# Patient Record
Sex: Male | Born: 1960 | Race: Black or African American | Hispanic: No | Marital: Single | Smoking: Never smoker
Health system: Southern US, Community
[De-identification: ages and names within clinical notes are randomized; demographics above are authoritative.]

## PROBLEM LIST (undated history)

## (undated) DIAGNOSIS — I10 Essential (primary) hypertension: Secondary | ICD-10-CM

---

## 2011-09-26 ENCOUNTER — Emergency Department: Payer: Self-pay | Admitting: Emergency Medicine

## 2012-06-14 ENCOUNTER — Emergency Department: Payer: Self-pay | Admitting: Internal Medicine

## 2015-10-09 ENCOUNTER — Emergency Department (HOSPITAL_COMMUNITY)
Admission: EM | Admit: 2015-10-09 | Discharge: 2015-10-10 | Disposition: A | Discharge status: Home / Self Care | Payer: Self-pay | Attending: Emergency Medicine | Admitting: Emergency Medicine

## 2015-10-09 ENCOUNTER — Encounter (HOSPITAL_COMMUNITY): Payer: Self-pay | Admitting: *Deleted

## 2015-10-09 DIAGNOSIS — Y9389 Activity, other specified: Secondary | ICD-10-CM | POA: Insufficient documentation

## 2015-10-09 DIAGNOSIS — Y92410 Unspecified street and highway as the place of occurrence of the external cause: Secondary | ICD-10-CM | POA: Insufficient documentation

## 2015-10-09 DIAGNOSIS — I10 Essential (primary) hypertension: Secondary | ICD-10-CM | POA: Insufficient documentation

## 2015-10-09 DIAGNOSIS — Z880 Allergy status to penicillin: Secondary | ICD-10-CM | POA: Insufficient documentation

## 2015-10-09 DIAGNOSIS — Y998 Other external cause status: Secondary | ICD-10-CM | POA: Insufficient documentation

## 2015-10-09 DIAGNOSIS — S29001A Unspecified injury of muscle and tendon of front wall of thorax, initial encounter: Secondary | ICD-10-CM | POA: Insufficient documentation

## 2015-10-09 HISTORY — DX: Essential (primary) hypertension: I10

## 2015-10-09 NOTE — ED Notes (Signed)
C/o pain to rib cage area and headache.

## 2015-10-09 NOTE — ED Notes (Signed)
pt states he has a hx of HTN  And was prescribed medicine a long time ago but does not take any.
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
## 2015-10-10 ENCOUNTER — Emergency Department (HOSPITAL_COMMUNITY): Payer: Self-pay

## 2015-10-10 MED ORDER — LISINOPRIL 10 MG PO TABS: 10.0000 mg | ORAL_TABLET | Freq: Every day | ORAL | Status: DC

## 2015-10-10 MED ORDER — DIAZEPAM 2 MG PO TABS: 2.0000 mg | ORAL_TABLET | Freq: Three times a day (TID) | ORAL | Status: DC
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
## 2015-10-10 NOTE — ED Notes (Signed)
Pt verbalized understanding of d/c instructions and has no further questions. Pt stable and NAD.  

## 2016-02-22 ENCOUNTER — Encounter: Payer: Self-pay | Admitting: Emergency Medicine

## 2016-02-22 ENCOUNTER — Emergency Department
Admission: EM | Admit: 2016-02-22 | Discharge: 2016-02-22 | Disposition: A | Discharge status: Home / Self Care | Payer: No Typology Code available for payment source | Attending: Emergency Medicine | Admitting: Emergency Medicine

## 2016-02-22 DIAGNOSIS — T7840XA Allergy, unspecified, initial encounter: Secondary | ICD-10-CM

## 2016-02-22 DIAGNOSIS — Z79899 Other long term (current) drug therapy: Secondary | ICD-10-CM | POA: Insufficient documentation

## 2016-02-22 DIAGNOSIS — I10 Essential (primary) hypertension: Secondary | ICD-10-CM | POA: Insufficient documentation

## 2016-02-22 MED ORDER — DIPHENHYDRAMINE HCL 50 MG/ML IJ SOLN
INTRAMUSCULAR | Status: AC
  Administered 2016-02-22: 50 mg via INTRAVENOUS
  Filled 2016-02-22: qty 1

## 2016-02-22 MED ORDER — METHYLPREDNISOLONE SODIUM SUCC 125 MG IJ SOLR
INTRAMUSCULAR | Status: AC
  Administered 2016-02-22: 120 mg via INTRAVENOUS
  Filled 2016-02-22: qty 2

## 2016-02-22 MED ORDER — FAMOTIDINE IN NACL 20-0.9 MG/50ML-% IV SOLN
INTRAVENOUS | Status: AC
  Administered 2016-02-22: 20 mg via INTRAVENOUS
  Filled 2016-02-22: qty 50

## 2016-02-22 MED ORDER — DIPHENHYDRAMINE HCL 50 MG/ML IJ SOLN
50.0000 mg | Freq: Once | INTRAMUSCULAR | Status: AC
  Administered 2016-02-22: 50 mg via INTRAVENOUS

## 2016-02-22 MED ORDER — FAMOTIDINE IN NACL 20-0.9 MG/50ML-% IV SOLN
20.0000 mg | Freq: Once | INTRAVENOUS | Status: AC
  Administered 2016-02-22: 20 mg via INTRAVENOUS

## 2016-02-22 MED ORDER — EPINEPHRINE 0.3 MG/0.3ML IJ SOAJ: 0.3000 mg | Freq: Once | INTRAMUSCULAR | Status: AC

## 2016-02-22 MED ORDER — METHYLPREDNISOLONE SODIUM SUCC 125 MG IJ SOLR
125.0000 mg | Freq: Once | INTRAMUSCULAR | Status: AC
  Administered 2016-02-22: 120 mg via INTRAVENOUS

## 2016-02-22 MED ORDER — PREDNISONE 50 MG PO TABS: 50.0000 mg | ORAL_TABLET | Freq: Every day | ORAL | Status: DC

## 2016-02-22 MED ORDER — SODIUM CHLORIDE 0.9 % IV BOLUS (SEPSIS)
1000.0000 mL | Freq: Once | INTRAVENOUS | Status: AC
  Administered 2016-02-22: 1000 mL via INTRAVENOUS

## 2016-02-22 NOTE — ED Notes (Signed)
Dr. Corky Downs at bedside assessing and speaking with patient.

## 2016-02-22 NOTE — ED Notes (Signed)
States he is having an allergic reaction to dye used to color his mustache. Onset ~ 30 mins ago. Rash, itching, anxious, +lip swelling, throat scratching.   Hx of similar episode when he use "just for men"

## 2016-02-22 NOTE — ED Notes (Signed)
Charge nurse aware of pt

## 2016-02-22 NOTE — ED Notes (Signed)
Patient takes lisinopril daily for BP.

## 2016-02-22 NOTE — ED Provider Notes (Signed)
[facility] Emergency Department Provider Note  ____________________________________________    I have reviewed the triage vital signs and the nursing notes.   HISTORY  Chief Complaint Allergic Reaction    HPI Michael Kemp is a 55 y.o. male who presents with complaints of an allergic reaction. He feels this is likely related to a dye that he is to color his mustache. He reports onset of diffuse itching, rash and scratchy throat and lip swellingapproximately 30 minutes prior to arrival. He denies shortness of breath or difficulty breathing. He reports similar episode in the past related to using "just for men". Of note he also uses lisinopril for blood pressure.     Past Medical History  Diagnosis Date  . Hypertension     There are no active problems to display for this patient.   History reviewed. No pertinent past surgical history.  Current Outpatient Rx  Name  Route  Sig  Dispense  Refill  . lisinopril (PRINIVIL,ZESTRIL) 10 MG tablet   Oral   Take 1 tablet (10 mg total) by mouth daily.   30 tablet   0   . diazepam (VALIUM) 2 MG tablet   Oral   Take 1 tablet (2 mg total) by mouth 3 (three) times daily.   10 tablet   0     For three days   . guaiFENesin (MUCINEX) 600 MG 12 hr tablet   Oral   Take 600 mg by mouth 2 (two) times daily as needed for cough or to loosen phlegm.           Allergies Amoxicillin  History reviewed. No pertinent family history.  Social History Social History  Substance Use Topics  . Smoking status: Never Smoker   . Smokeless tobacco: None  . Alcohol Use: No    Review of Systems  Constitutional: Negative for fever. Eyes: Negative for redness ENT: As above Cardiovascular: Negative for chest pain Respiratory: Negative for shortness of breath. No stridor Gastrointestinal: Negative for abdominal pain Genitourinary: Negative for dysuria. Musculoskeletal: Negative for back pain. Skin: As  above Neurological: Negative for focal weakness Psychiatric: no anxiety    ____________________________________________   PHYSICAL EXAM:  VITAL SIGNS: ED Triage Vitals  Enc Vitals Group     BP 02/22/16 1215 90/49 mmHg     Pulse Rate 02/22/16 1215 110     Resp 02/22/16 1215 18     Temp 02/22/16 1215 97.7 F (36.5 C)     Temp Source 02/22/16 1215 Oral     SpO2 02/22/16 1215 98 %     Weight 02/22/16 1215 220 lb (99.791 kg)     Height 02/22/16 1215 5\' 9"  (1.753 m)     Head Cir --      Peak Flow --      Pain Score 02/22/16 1217 9     Pain Loc --      Pain Edu? --      Excl. in [place]? --      Constitutional: Alert and oriented. Well appearing and in no distress.  Eyes: Conjunctivae are normal. No erythema or injection ENT   Head: Normocephalic and atraumatic.   Mouth/Throat: Mucous membranes are moist.Pharynx is normal. Right upper lip is slightly enlarged, no intraoral swelling Cardiovascular: Normal rate, regular rhythm. Normal and symmetric distal pulses are present in the upper extremities.  Respiratory: Normal respiratory effort without tachypnea nor retractions. Breath sounds are clear and equal bilaterally.  Gastrointestinal: Soft and non-tender in all quadrants.  No distention. There is no CVA tenderness. Genitourinary: deferred Musculoskeletal: Nontender with normal range of motion in all extremities. No lower extremity tenderness nor edema. Neurologic:  Normal speech and language. No gross focal neurologic deficits are appreciated. Skin:  Skin is warm, dry and intact. No visible rash Psychiatric: Mood and affect are normal. Patient exhibits appropriate insight and judgment.  ____________________________________________    LABS (pertinent positives/negatives)  Labs Reviewed - No data to display  ____________________________________________   EKG  None  ____________________________________________     RADIOLOGY  None  ____________________________________________   PROCEDURES  Procedure(s) performed: none  Critical Care performed: none  ____________________________________________   INITIAL IMPRESSION / ASSESSMENT AND PLAN / ED COURSE  Pertinent labs & imaging results that were available during my care of the patient were reviewed by me and considered in my medical decision making (see chart for details).  Patient given IV Solu-Medrol, Pepcid, Benadryl and IV fluids for suspected allergic reaction. I discussed my concerns that lisinopril may be the actual cause of his recurrent symptoms and recommended that he stopped the medication the patient agreed to do so he will change his medication with his PCP. We will observe the patient in the ED and reevaluate  ----------------------------------------- 3:27 PM on 02/22/2016 -----------------------------------------  Patient resting comfortably. He reports near complete resolution of his symptoms. I feel he is safe for discharge now I'll discharge him with prednisone for 5 days. Recommend DC lisinopril. EpiPen prescribed as well  ____________________________________________   FINAL CLINICAL IMPRESSION(S) / ED DIAGNOSES  Final diagnoses:  Allergic reaction, initial encounter          Lavonia Drafts, MD 02/22/16 1527

## 2016-02-22 NOTE — Discharge Instructions (Signed)

## 2022-01-13 ENCOUNTER — Inpatient Hospital Stay (HOSPITAL_COMMUNITY)
Admission: EM | Admit: 2022-01-13 | Discharge: 2022-02-01 | DRG: 871 | Disposition: E | Payer: Medicaid Other | Attending: Emergency Medicine | Admitting: Emergency Medicine

## 2022-01-13 ENCOUNTER — Encounter (HOSPITAL_COMMUNITY): Payer: Self-pay | Admitting: Emergency Medicine

## 2022-01-13 ENCOUNTER — Emergency Department (HOSPITAL_COMMUNITY): Payer: Medicaid Other

## 2022-01-13 ENCOUNTER — Other Ambulatory Visit: Payer: Self-pay

## 2022-01-13 DIAGNOSIS — R6521 Severe sepsis with septic shock: Secondary | ICD-10-CM | POA: Diagnosis present

## 2022-01-13 DIAGNOSIS — G9341 Metabolic encephalopathy: Secondary | ICD-10-CM | POA: Diagnosis present

## 2022-01-13 DIAGNOSIS — Z9221 Personal history of antineoplastic chemotherapy: Secondary | ICD-10-CM

## 2022-01-13 DIAGNOSIS — C7949 Secondary malignant neoplasm of other parts of nervous system: Secondary | ICD-10-CM | POA: Diagnosis present

## 2022-01-13 DIAGNOSIS — E440 Moderate protein-calorie malnutrition: Secondary | ICD-10-CM | POA: Diagnosis not present

## 2022-01-13 DIAGNOSIS — Z800 Family history of malignant neoplasm of digestive organs: Secondary | ICD-10-CM

## 2022-01-13 DIAGNOSIS — J90 Pleural effusion, not elsewhere classified: Secondary | ICD-10-CM | POA: Diagnosis not present

## 2022-01-13 DIAGNOSIS — I63441 Cerebral infarction due to embolism of right cerebellar artery: Secondary | ICD-10-CM | POA: Diagnosis present

## 2022-01-13 DIAGNOSIS — C787 Secondary malignant neoplasm of liver and intrahepatic bile duct: Secondary | ICD-10-CM | POA: Diagnosis present

## 2022-01-13 DIAGNOSIS — I2609 Other pulmonary embolism with acute cor pulmonale: Secondary | ICD-10-CM | POA: Diagnosis not present

## 2022-01-13 DIAGNOSIS — Z7189 Other specified counseling: Secondary | ICD-10-CM | POA: Diagnosis not present

## 2022-01-13 DIAGNOSIS — E876 Hypokalemia: Secondary | ICD-10-CM | POA: Diagnosis present

## 2022-01-13 DIAGNOSIS — L8990 Pressure ulcer of unspecified site, unspecified stage: Secondary | ICD-10-CM | POA: Diagnosis present

## 2022-01-13 DIAGNOSIS — D649 Anemia, unspecified: Secondary | ICD-10-CM | POA: Diagnosis present

## 2022-01-13 DIAGNOSIS — R188 Other ascites: Secondary | ICD-10-CM | POA: Diagnosis present

## 2022-01-13 DIAGNOSIS — G931 Anoxic brain damage, not elsewhere classified: Secondary | ICD-10-CM | POA: Diagnosis present

## 2022-01-13 DIAGNOSIS — H5500 Unspecified nystagmus: Secondary | ICD-10-CM | POA: Diagnosis present

## 2022-01-13 DIAGNOSIS — N170 Acute kidney failure with tubular necrosis: Secondary | ICD-10-CM | POA: Diagnosis present

## 2022-01-13 DIAGNOSIS — R64 Cachexia: Secondary | ICD-10-CM | POA: Diagnosis present

## 2022-01-13 DIAGNOSIS — I2699 Other pulmonary embolism without acute cor pulmonale: Secondary | ICD-10-CM | POA: Diagnosis present

## 2022-01-13 DIAGNOSIS — E509 Vitamin A deficiency, unspecified: Secondary | ICD-10-CM | POA: Diagnosis present

## 2022-01-13 DIAGNOSIS — J690 Pneumonitis due to inhalation of food and vomit: Secondary | ICD-10-CM | POA: Diagnosis present

## 2022-01-13 DIAGNOSIS — A419 Sepsis, unspecified organism: Secondary | ICD-10-CM | POA: Diagnosis present

## 2022-01-13 DIAGNOSIS — I82413 Acute embolism and thrombosis of femoral vein, bilateral: Secondary | ICD-10-CM | POA: Diagnosis not present

## 2022-01-13 DIAGNOSIS — L89152 Pressure ulcer of sacral region, stage 2: Secondary | ICD-10-CM | POA: Diagnosis present

## 2022-01-13 DIAGNOSIS — Z79899 Other long term (current) drug therapy: Secondary | ICD-10-CM

## 2022-01-13 DIAGNOSIS — E1165 Type 2 diabetes mellitus with hyperglycemia: Secondary | ICD-10-CM | POA: Diagnosis not present

## 2022-01-13 DIAGNOSIS — C259 Malignant neoplasm of pancreas, unspecified: Secondary | ICD-10-CM | POA: Diagnosis present

## 2022-01-13 DIAGNOSIS — I10 Essential (primary) hypertension: Secondary | ICD-10-CM | POA: Diagnosis present

## 2022-01-13 DIAGNOSIS — K7200 Acute and subacute hepatic failure without coma: Secondary | ICD-10-CM | POA: Diagnosis present

## 2022-01-13 DIAGNOSIS — E870 Hyperosmolality and hypernatremia: Secondary | ICD-10-CM | POA: Diagnosis not present

## 2022-01-13 DIAGNOSIS — I6350 Cerebral infarction due to unspecified occlusion or stenosis of unspecified cerebral artery: Secondary | ICD-10-CM | POA: Diagnosis not present

## 2022-01-13 DIAGNOSIS — C7931 Secondary malignant neoplasm of brain: Secondary | ICD-10-CM | POA: Diagnosis present

## 2022-01-13 DIAGNOSIS — Z6821 Body mass index (BMI) 21.0-21.9, adult: Secondary | ICD-10-CM

## 2022-01-13 DIAGNOSIS — K219 Gastro-esophageal reflux disease without esophagitis: Secondary | ICD-10-CM | POA: Diagnosis present

## 2022-01-13 DIAGNOSIS — I82409 Acute embolism and thrombosis of unspecified deep veins of unspecified lower extremity: Secondary | ICD-10-CM | POA: Diagnosis not present

## 2022-01-13 DIAGNOSIS — E43 Unspecified severe protein-calorie malnutrition: Secondary | ICD-10-CM | POA: Diagnosis present

## 2022-01-13 DIAGNOSIS — I82463 Acute embolism and thrombosis of calf muscular vein, bilateral: Secondary | ICD-10-CM | POA: Diagnosis present

## 2022-01-13 DIAGNOSIS — I639 Cerebral infarction, unspecified: Secondary | ICD-10-CM | POA: Diagnosis present

## 2022-01-13 DIAGNOSIS — N179 Acute kidney failure, unspecified: Secondary | ICD-10-CM | POA: Diagnosis present

## 2022-01-13 DIAGNOSIS — Z20822 Contact with and (suspected) exposure to covid-19: Secondary | ICD-10-CM | POA: Diagnosis present

## 2022-01-13 DIAGNOSIS — G936 Cerebral edema: Secondary | ICD-10-CM | POA: Diagnosis present

## 2022-01-13 DIAGNOSIS — E874 Mixed disorder of acid-base balance: Secondary | ICD-10-CM | POA: Diagnosis present

## 2022-01-13 DIAGNOSIS — Z91013 Allergy to seafood: Secondary | ICD-10-CM

## 2022-01-13 DIAGNOSIS — I63541 Cerebral infarction due to unspecified occlusion or stenosis of right cerebellar artery: Secondary | ICD-10-CM | POA: Diagnosis not present

## 2022-01-13 DIAGNOSIS — J9601 Acute respiratory failure with hypoxia: Secondary | ICD-10-CM | POA: Diagnosis not present

## 2022-01-13 DIAGNOSIS — Z931 Gastrostomy status: Secondary | ICD-10-CM

## 2022-01-13 DIAGNOSIS — R627 Adult failure to thrive: Secondary | ICD-10-CM | POA: Diagnosis present

## 2022-01-13 DIAGNOSIS — I82453 Acute embolism and thrombosis of peroneal vein, bilateral: Secondary | ICD-10-CM | POA: Diagnosis present

## 2022-01-13 DIAGNOSIS — A4101 Sepsis due to Methicillin susceptible Staphylococcus aureus: Secondary | ICD-10-CM | POA: Diagnosis not present

## 2022-01-13 DIAGNOSIS — Z515 Encounter for palliative care: Secondary | ICD-10-CM | POA: Diagnosis not present

## 2022-01-13 DIAGNOSIS — Z66 Do not resuscitate: Secondary | ICD-10-CM | POA: Diagnosis not present

## 2022-01-13 DIAGNOSIS — R1310 Dysphagia, unspecified: Secondary | ICD-10-CM | POA: Diagnosis present

## 2022-01-13 DIAGNOSIS — G935 Compression of brain: Secondary | ICD-10-CM | POA: Diagnosis present

## 2022-01-13 DIAGNOSIS — E875 Hyperkalemia: Secondary | ICD-10-CM | POA: Diagnosis not present

## 2022-01-13 DIAGNOSIS — I82443 Acute embolism and thrombosis of tibial vein, bilateral: Secondary | ICD-10-CM | POA: Diagnosis present

## 2022-01-13 DIAGNOSIS — R4182 Altered mental status, unspecified: Secondary | ICD-10-CM | POA: Diagnosis not present

## 2022-01-13 DIAGNOSIS — I82433 Acute embolism and thrombosis of popliteal vein, bilateral: Secondary | ICD-10-CM | POA: Diagnosis present

## 2022-01-13 DIAGNOSIS — E8809 Other disorders of plasma-protein metabolism, not elsewhere classified: Secondary | ICD-10-CM | POA: Diagnosis present

## 2022-01-13 DIAGNOSIS — D72823 Leukemoid reaction: Secondary | ICD-10-CM | POA: Diagnosis not present

## 2022-01-13 DIAGNOSIS — R29704 NIHSS score 4: Secondary | ICD-10-CM | POA: Diagnosis present

## 2022-01-13 DIAGNOSIS — D6869 Other thrombophilia: Secondary | ICD-10-CM | POA: Diagnosis not present

## 2022-01-13 DIAGNOSIS — D696 Thrombocytopenia, unspecified: Secondary | ICD-10-CM | POA: Diagnosis present

## 2022-01-13 DIAGNOSIS — R531 Weakness: Secondary | ICD-10-CM | POA: Diagnosis not present

## 2022-01-13 LAB — COMPREHENSIVE METABOLIC PANEL
ALT: 64 U/L — ABNORMAL HIGH (ref 0–44)
AST: 41 U/L (ref 15–41)
Albumin: 1.6 g/dL — ABNORMAL LOW (ref 3.5–5.0)
Alkaline Phosphatase: 281 U/L — ABNORMAL HIGH (ref 38–126)
Anion gap: 9 (ref 5–15)
BUN: 29 mg/dL — ABNORMAL HIGH (ref 6–20)
CO2: 27 mmol/L (ref 22–32)
Calcium: 8.1 mg/dL — ABNORMAL LOW (ref 8.9–10.3)
Chloride: 100 mmol/L (ref 98–111)
Creatinine, Ser: 1.11 mg/dL (ref 0.61–1.24)
GFR, Estimated: >60 mL/min (ref >60)
Glucose, Bld: 142 mg/dL — ABNORMAL HIGH (ref 70–99)
Potassium: 4.2 mmol/L (ref 3.5–5.1)
Sodium: 136 mmol/L (ref 135–145)
Total Bilirubin: 1.4 mg/dL — ABNORMAL HIGH (ref 0.3–1.2)
Total Protein: 6.4 g/dL — ABNORMAL LOW (ref 6.5–8.1)

## 2022-01-13 LAB — CBC WITH DIFFERENTIAL/PLATELET
Abs Immature Granulocytes: 0 10*3/uL (ref 0.00–0.07)
Basophils Absolute: 0 10*3/uL (ref 0.0–0.1)
Basophils Relative: 0 %
Eosinophils Absolute: 0.6 10*3/uL — ABNORMAL HIGH (ref 0.0–0.5)
Eosinophils Relative: 2 %
HCT: 26.9 % — ABNORMAL LOW (ref 39.0–52.0)
Hemoglobin: 8.8 g/dL — ABNORMAL LOW (ref 13.0–17.0)
Lymphocytes Relative: 4 %
Lymphs Abs: 1.2 10*3/uL (ref 0.7–4.0)
MCH: 28.1 pg (ref 26.0–34.0)
MCHC: 32.7 g/dL (ref 30.0–36.0)
MCV: 85.9 fL (ref 80.0–100.0)
Monocytes Absolute: 1.2 10*3/uL — ABNORMAL HIGH (ref 0.1–1.0)
Monocytes Relative: 4 %
Neutro Abs: 27.1 10*3/uL — ABNORMAL HIGH (ref 1.7–7.7)
Neutrophils Relative %: 90 %
Platelets: 151 10*3/uL (ref 150–400)
RBC: 3.13 MIL/uL — ABNORMAL LOW (ref 4.22–5.81)
RDW: 15.6 % — ABNORMAL HIGH (ref 11.5–15.5)
WBC: 30.1 10*3/uL — ABNORMAL HIGH (ref 4.0–10.5)
nRBC: 0.1 % (ref 0.0–0.2)

## 2022-01-13 LAB — RESP PANEL BY RT-PCR (FLU A&B, COVID) ARPGX2
Influenza A by PCR: NEGATIVE
Influenza B by PCR: NEGATIVE
SARS Coronavirus 2 by RT PCR: NEGATIVE

## 2022-01-13 LAB — CBG MONITORING, ED: Glucose-Capillary: 152 mg/dL — ABNORMAL HIGH (ref 70–99)

## 2022-01-13 LAB — LACTIC ACID, PLASMA
Lactic Acid, Venous: 3 mmol/L (ref 0.5–1.9)
Lactic Acid, Venous: 3.1 mmol/L (ref 0.5–1.9)

## 2022-01-13 LAB — AMMONIA: Ammonia: 25 umol/L (ref 9–35)

## 2022-01-13 MED ORDER — LACTATED RINGERS IV SOLN: INTRAVENOUS | Status: DC

## 2022-01-13 MED ORDER — ENOXAPARIN SODIUM 40 MG/0.4ML IJ SOSY
40.0000 mg | PREFILLED_SYRINGE | INTRAMUSCULAR | Status: DC
  Administered 2022-01-14: 40 mg via SUBCUTANEOUS
  Filled 2022-01-13: qty 0.4

## 2022-01-13 MED ORDER — LACTATED RINGERS IV BOLUS (SEPSIS)
250.0000 mL | Freq: Once | INTRAVENOUS | Status: AC
  Administered 2022-01-13: 250 mL via INTRAVENOUS

## 2022-01-13 MED ORDER — METRONIDAZOLE 500 MG/100ML IV SOLN
500.0000 mg | Freq: Once | INTRAVENOUS | Status: AC
  Administered 2022-01-13: 500 mg via INTRAVENOUS
  Filled 2022-01-13: qty 100

## 2022-01-13 MED ORDER — SODIUM CHLORIDE 0.9 % IV SOLN
2.0000 g | Freq: Once | INTRAVENOUS | Status: AC
  Administered 2022-01-13: 2 g via INTRAVENOUS
  Filled 2022-01-13: qty 12.5

## 2022-01-13 MED ORDER — ORAL CARE MOUTH RINSE
15.0000 mL | Freq: Two times a day (BID) | OROMUCOSAL | Status: DC
  Administered 2022-01-14: 15 mL via OROMUCOSAL

## 2022-01-13 MED ORDER — VANCOMYCIN HCL 1500 MG/300ML IV SOLN
1500.0000 mg | Freq: Once | INTRAVENOUS | Status: AC
  Administered 2022-01-13: 1500 mg via INTRAVENOUS
  Filled 2022-01-13: qty 300

## 2022-01-13 MED ORDER — SODIUM CHLORIDE 0.9 % IV BOLUS
1000.0000 mL | Freq: Once | INTRAVENOUS | Status: AC
  Administered 2022-01-13: 1000 mL via INTRAVENOUS

## 2022-01-13 MED ORDER — LACTATED RINGERS IV BOLUS (SEPSIS)
1000.0000 mL | Freq: Once | INTRAVENOUS | Status: AC
  Administered 2022-01-13: 1000 mL via INTRAVENOUS

## 2022-01-13 MED ORDER — VANCOMYCIN HCL IN DEXTROSE 1-5 GM/200ML-% IV SOLN
1000.0000 mg | Freq: Once | INTRAVENOUS | Status: DC
Start: 2022-01-13 — End: 2022-01-13

## 2022-01-13 MED ORDER — CHLORHEXIDINE GLUCONATE CLOTH 2 % EX PADS
6.0000 | MEDICATED_PAD | Freq: Every day | CUTANEOUS | Status: DC
  Administered 2022-01-14 – 2022-01-19 (×7): 6 via TOPICAL

## 2022-01-13 NOTE — H&P (Addendum)
?History and Physical ? ?Michael Kemp [id number]:[id number] DOB: 05/10/1961 DOA: 01/14/2022 ? ?Referring physician: Lavonna Rua, PA-EDP  ?PCP: Lorelee Market, MD  ?Outpatient Specialists: Medical oncology [facility] ?Patient coming from: Home ? ?Chief Complaint: AMS  ? ?HPI: Michael Kemp is a 61 y.o. 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  Prior to this, the patient was interactive with family, he was showing them how to fold sheets in the beds in their basement as he lives at home with his daughter and son-in-law.  He suddenly became confused.  He complained of abdominal pain at that time per his daughter.  EMS was activated and he was brought into the ED for further evaluation.  In the ED, the patient is somnolent but arousable to voices.  WBC 30,000, lactic acid 3.0, chest x-ray and UA unrevealing.  Due to concern for sepsis code sepsis was called in the ED.  Blood cultures were obtained and the patient was started on IV fluid hydration and broad-spectrum IV antibiotics, IV vancomycin, cefepime and IV Flagyl.  TRH, hospitalist service, was asked to admit. ? ?ED Course: Tmax 98.3.  BP 147/102, pulse 76, respiratory rate 19, O2 saturation 100% on room air.  Lab studies significant for WBC 30.1 K, hemoglobin 8.8, glucose 142, BUN 29, creatinine 1.11, ALT 64, T bilirubin 1.4.  Lactic acid 3.1 from 3.0. ? ?Review of Systems: ?Review of systems as noted in the HPI. All other systems reviewed and are negative. ? ? ?Past Medical History:  ?Diagnosis Date  ? Hypertension   ? ?History reviewed. No pertinent surgical history. ? ?Social History:  reports that he has never smoked. He does not have any smokeless tobacco history on file. He reports that he  does not drink alcohol and does not use drugs. ? ? ?Allergies  ?Allergen Reactions  ? Lisinopril Anaphylaxis  ? Amoxicillin Nausea And Vomiting  ? Aripiprazole Other (See Comments)  ?  Made patient feel "really low" per daughter at bedside  ? Olmesartan Other (See Comments)  ?  "bothered stomach"  ? Shellfish Allergy Nausea Only  ?  "Bad headache"  ? ? ?Family history: ?Father with history of unspecified cancer ?Maternal uncle unspecified cancer in the 45s. ?Maternal aunt pancreatic cancer in her 68s. ? ?Prior to Admission medications   ?Medication Sig Start Date End Date Taking? Authorizing Provider  ?chlorproMAZINE (THORAZINE) 25 MG tablet Take 25 mg by mouth 3 (three) times daily. hiccups 01/08/22 02/07/22 Yes [provider]  ?gabapentin (NEURONTIN) 400 MG capsule Take 400 mg by mouth 2 (two) times daily as needed (hiccups). 01/08/22 02/07/22 Yes [provider]  ?guaiFENesin (ROBITUSSIN) 100 MG/5ML liquid Take 10 mLs by mouth every 4 (four) hours as needed for cough. 01/08/22  Yes [provider]  ?loperamide (IMODIUM) 2 MG capsule If having diarrhea, take 2 capsules (4 mg) after the first loose stool, then 1 capsule after each additional loose stool. 11/25/21 11/25/22 Yes [provider]  ?magic mouthwash (nystatin, diphenhydrAMINE, alum & mag hydroxide) suspension mixture Take 10 mLs by mouth every 2 (two) hours as needed for mouth pain. 01/08/22 02/07/22 Yes [provider]  ?ondansetron (ZOFRAN-ODT) 8 MG disintegrating tablet Take 8 mg by mouth every 8 (eight) hours as  needed for nausea. 01/08/22 02/07/22 Yes [provider]  ?phytonadione (VITAMIN K) 5 MG tablet Take 5 mg by mouth once a week. Tuesday 01/11/22 01/11/23 Yes [provider]  ?potassium & sodium phosphates (PHOS-NAK) 280-160-250 MG PACK Take by mouth. 01/08/22  Yes [provider]  ?thiamine 100 MG tablet Take 100 mg by mouth daily. 01/09/22  Yes [provider]  ?vitamin A 3 MG (10000  UNITS) capsule Take 6 mg by mouth daily. 01/09/22 01/09/23 Yes [provider]  ?EPINEPHrine 0.3 mg/0.3 mL IJ SOAJ injection Inject 0.3 mLs (0.3 mg total) into the muscle once. ?Patient not taking: Reported on 01/02/2022 02/22/16   Lavonia Drafts, MD  ? ? ?Physical Exam: ?BP 103/72   Pulse 85   Temp 98.3 ?F (36.8 ?C) (Oral)   Resp 19   Wt 66.2 kg   SpO2 100%   BMI 21.56 kg/m?  ? ?General: 61 y.o. year-old male well developed well nourished in no acute distress.  Somnolent but arousable to voices.  He follows commands when aroused. ?Cardiovascular: Regular rate and rhythm with no rubs or gallops.  No thyromegaly or JVD noted.  Trace lower extremity edema. 2/4 pulses in all 4 extremities. ?Respiratory: Mild rales at bases.  No wheezing noted.  Poor inspiratory effort. ?Abdomen: Soft nontender nondistended with normal bowel sounds x4 quadrants. ?Muskuloskeletal: No cyanosis or clubbing.  Trace edema noted bilaterally ?Neuro: CN II-XII intact, strength, sensation, reflexes ?Skin: Stage II sacral wound. ?Psychiatry: Judgement and insight appear normal. Mood is appropriate for condition and setting ?   ?   ?   ?Labs on Admission:  ?Basic Metabolic Panel: ?Recent Labs  ?Lab 01/21/2022 ?1739  ?NA 136  ?K 4.2  ?CL 100  ?CO2 27  ?GLUCOSE 142*  ?BUN 29*  ?CREATININE 1.11  ?CALCIUM 8.1*  ? ?Liver Function Tests: ?Recent Labs  ?Lab 01/28/2022 ?1739  ?AST 41  ?ALT 64*  ?ALKPHOS 281*  ?BILITOT 1.4*  ?PROT 6.4*  ?ALBUMIN 1.6*  ? ?No results for input(s): LIPASE, AMYLASE in the last 168 hours. ?Recent Labs  ?Lab 01/22/2022 ?1739  ?AMMONIA 25  ? ?CBC: ?Recent Labs  ?Lab 01/27/2022 ?1739  ?WBC 30.1*  ?NEUTROABS 27.1*  ?HGB 8.8*  ?HCT 26.9*  ?MCV 85.9  ?PLT 151  ? ?Cardiac Enzymes: ?No results for input(s): CKTOTAL, CKMB, CKMBINDEX, TROPONINI in the last 168 hours. ? ?BNP (last 3 results) ?No results for input(s): BNP in the last 8760 hours. ? ?ProBNP (last 3 results) ?No results for input(s): PROBNP in the last 8760  hours. ? ?CBG: ?Recent Labs  ?Lab 01/05/2022 ?1809  ?GLUCAP 152*  ? ? ?Radiological Exams on Admission: ?CT HEAD WO CONTRAST ? ?Result Date: 01/19/2022 ?CLINICAL DATA:  Neuro deficit, acute, stroke suspected EXAM: CT HEAD WITHOUT CONTRAST TECHNIQUE: Contiguous axial images were obtained from the base of the skull through the vertex without intravenous contrast. RADIATION DOSE REDUCTION: This exam was performed according to the departmental dose-optimization program which includes automated exposure control, adjustment of the mA and/or kV according to patient size and/or use of iterative reconstruction technique. COMPARISON:  None. FINDINGS: Brain: No intracranial hemorrhage, mass effect, or midline shift. No hydrocephalus. The basilar cisterns are patent. No evidence of territorial infarct or acute ischemia. No extra-axial or intracranial fluid collection. Vascular: No hyperdense vessel or unexpected calcification. Skull: Normal. Negative for fracture or focal lesion. Sinuses/Orbits: Paranasal sinuses and mastoid air cells are clear. The visualized orbits are unremarkable. Other: None. IMPRESSION: Negative noncontrast head CT. Electronically  Signed   By: Keith Rake M.D.   On: 01/03/2022 18:07  ? ?DG Chest Port 1 View ? ?Result Date: 01/07/2022 ?CLINICAL DATA:  Weakness, abdominal pain and shortness of breath. EXAM: PORTABLE CHEST 1 VIEW COMPARISON:  10/10/2015 FINDINGS: Right chest port with tip at the right atrium. There are streaky opacities at the right lung base. Stable heart size. Similar aortic tortuosity. No pulmonary edema, pleural effusion, or pneumothorax. No acute osseous abnormalities are seen. IMPRESSION: Streaky right lung base opacities, favor atelectasis over pneumonia. Electronically Signed   By: Keith Rake M.D.   On: 01/24/2022 19:16   ? ?EKG: I independently viewed the EKG done and my findings are as followed: Sinus tachycardia rate of 137.  Nonspecific ST-T changes.  QTc  452. ? ?Assessment/Plan ?Present on Admission: ? Sepsis ([place]) ? ?Principal Problem: ?  Sepsis ([place]) ? ?Sepsis, unclear source ?Presented with leukocytosis, WBC 30,000, lactic acid 3.1, heart rate 137, respiratory 28, no clear source of infection

## 2022-01-13 NOTE — ED Provider Notes (Signed)
?[facility] DEPT ?Provider Note ? ? ?CSN: [id number] ?Arrival date & time: 01/23/2022  1505 ? ?  ? ?History ? ?Chief Complaint  ?Patient presents with  ? Abdominal Pain  ? Weakness  ? ? ?Michael Kemp is a 61 y.o. male. ? 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  History provided by patient's sister and daughter who the patient lives with and are present in the room.  They state that earlier today the patient was up and walking around and folding clothes when he suddenly became confused and lethargic and stated he was unable to hold his head up.  Family states that this was around 2 PM today.  They state that prior to this event the patient was behaving normally and had no complaints.  The patient who is able to mumble short responses denies any pain or symptoms.  Of note, patient is not actively on chemotherapy and receives all of his oncology care through [facility].  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  ? ?The history is provided by the patient. No language interpreter was used.  ?Abdominal Pain ?Associated symptoms: fatigue   ?Associated symptoms: no chest pain, no chills, no fever and no shortness of breath   ?Weakness ?Associated symptoms: no abdominal pain, no chest pain, no fever and no shortness of breath   ? ?  ? ?Home Medications ?Prior to Admission medications   ?Medication Sig Start Date End Date Taking? Authorizing Provider  ?diazepam (VALIUM) 2 MG tablet Take 1 tablet (2 mg total) by mouth 3 (three) times daily. 10/10/15   Carmin Muskrat, MD  ?EPINEPHrine 0.3 mg/0.3 mL IJ SOAJ injection Inject 0.3 mLs (0.3 mg total) into the muscle once. 02/22/16   Lavonia Drafts, MD  ?guaiFENesin (MUCINEX) 600 MG 12 hr tablet Take 600 mg by mouth 2 (two) times daily as needed for cough  or to loosen phlegm.    [provider]  ?lisinopril (PRINIVIL,ZESTRIL) 10 MG tablet Take 1 tablet (10 mg total) by mouth daily. 10/10/15   Carmin Muskrat, MD  ?predniSONE (DELTASONE) 50 MG tablet Take 1 tablet (50 mg total) by mouth daily with breakfast. 02/22/16   Lavonia Drafts, MD  ?   ? ?Allergies    ?Amoxicillin   ? ?Review of Systems   ?Review of Systems  ?Constitutional:  Positive for fatigue. Negative for chills and fever.  ?Respiratory:  Negative for shortness of breath.   ?Cardiovascular:  Negative for chest pain.  ?Gastrointestinal:  Negative for abdominal pain.  ?Musculoskeletal:  Negative for neck pain and neck stiffness.  ?Neurological:  Negative for weakness.  ?All other systems reviewed and are negative. ? ?Physical Exam ?Updated Vital Signs ?BP 104/73   Pulse (!) 104   Temp 98.3 ?F (36.8 ?C) (Oral)   Resp 17   SpO2 98%  ?Physical Exam ?Vitals and nursing note reviewed.  ?Constitutional:   ?   Appearance: Normal appearance.  ?   Comments: Patient chronically ill appearing laying in bed in no acute distress  ?HENT:  ?   Head: Normocephalic and atraumatic.  ?Cardiovascular:  ?   Rate and Rhythm: Normal rate and regular rhythm.  ?   Heart sounds: Normal heart sounds.  ?Pulmonary:  ?   Effort: Pulmonary effort is normal. No respiratory distress.  ?  Breath sounds: Normal breath sounds.  ?Abdominal:  ?   General: Abdomen is flat.  ?   Palpations: Abdomen is soft.  ?   Tenderness: There is no abdominal tenderness.  ?   Comments: GJ tube in place non-infectious appearing  ?Musculoskeletal:     ?   General: Normal range of motion.  ?   Cervical back: Normal range of motion.  ?Skin: ?   General: Skin is warm and dry.  ?Neurological:  ?   Comments: Patient very somnolent and slow to respond not following commands but is alert and oriented x 3 and answering questions appropriately  ?Psychiatric:     ?   Mood and Affect: Mood normal.     ?   Behavior: Behavior normal.  ? ? ?ED Results / Procedures  / Treatments   ?Labs ?(all labs ordered are listed, but only abnormal results are displayed) ?Labs Reviewed  ?COMPREHENSIVE METABOLIC PANEL - Abnormal; Notable for the following components:  ?    Result Value  ? Glucose, Bld 142 (*)   ? BUN 29 (*)   ? Calcium 8.1 (*)   ? Total Protein 6.4 (*)   ? Albumin 1.6 (*)   ? ALT 64 (*)   ? Alkaline Phosphatase 281 (*)   ? Total Bilirubin 1.4 (*)   ? All other components within normal limits  ?CBC WITH DIFFERENTIAL/PLATELET - Abnormal; Notable for the following components:  ? WBC 30.1 (*)   ? RBC 3.13 (*)   ? Hemoglobin 8.8 (*)   ? HCT 26.9 (*)   ? RDW 15.6 (*)   ? Neutro Abs 27.1 (*)   ? Monocytes Absolute 1.2 (*)   ? Eosinophils Absolute 0.6 (*)   ? All other components within normal limits  ?CBG MONITORING, ED - Abnormal; Notable for the following components:  ? Glucose-Capillary 152 (*)   ? All other components within normal limits  ?RESP PANEL BY RT-PCR (FLU A&B, COVID) [id number]  ?CULTURE, BLOOD (SINGLE)  ?AMMONIA  ?URINALYSIS, ROUTINE W REFLEX MICROSCOPIC  ?RAPID URINE DRUG SCREEN, HOSP PERFORMED  ?LACTIC ACID, PLASMA  ?LACTIC ACID, PLASMA  ? ? ?EKG ?EKG Interpretation ? ?Date/Time:  Wednesday January 13 2022 15:31:15 EDT ?Ventricular Rate:  137 ?PR Interval:  117 ?QRS Duration: 88 ?QT Interval:  299 ?QTC Calculation: 452 ?R Axis:   59 ?Text Interpretation: Sinus tachycardia Borderline repolarization abnormality Confirmed by Octaviano Glow [phone]) on 01/18/2022 5:36:56 PM ? ?Radiology ?CT HEAD WO CONTRAST ? ?Result Date: 01/19/2022 ?CLINICAL DATA:  Neuro deficit, acute, stroke suspected EXAM: CT HEAD WITHOUT CONTRAST TECHNIQUE: Contiguous axial images were obtained from the base of the skull through the vertex without intravenous contrast. RADIATION DOSE REDUCTION: This exam was performed according to the departmental dose-optimization program which includes automated exposure control, adjustment of the mA and/or kV according to patient size and/or use of iterative  reconstruction technique. COMPARISON:  None. FINDINGS: Brain: No intracranial hemorrhage, mass effect, or midline shift. No hydrocephalus. The basilar cisterns are patent. No evidence of territorial infarct or acute ischemia. No extra-axial or intracranial fluid collection. Vascular: No hyperdense vessel or unexpected calcification. Skull: Normal. Negative for fracture or focal lesion. Sinuses/Orbits: Paranasal sinuses and mastoid air cells are clear. The visualized orbits are unremarkable. Other: None. IMPRESSION: Negative noncontrast head CT. Electronically Signed   By: Keith Rake M.D.   On: 01/15/2022 18:07  ? ?DG Chest Port 1 View ? ?Result Date: 01/18/2022 ?CLINICAL DATA:  Weakness, abdominal pain and shortness of  breath. EXAM: PORTABLE CHEST 1 VIEW COMPARISON:  10/10/2015 FINDINGS: Right chest port with tip at the right atrium. There are streaky opacities at the right lung base. Stable heart size. Similar aortic tortuosity. No pulmonary edema, pleural effusion, or pneumothorax. No acute osseous abnormalities are seen. IMPRESSION: Streaky right lung base opacities, favor atelectasis over pneumonia. Electronically Signed   By: Keith Rake M.D.   On: 01/10/2022 19:16   ? ?Procedures ?Procedures  ? ? ?Medications Ordered in ED ?Medications  ?lactated ringers infusion (has no administration in time range)  ?ceFEPIme (MAXIPIME) 2 g in sodium chloride 0.9 % 100 mL IVPB (has no administration in time range)  ?metroNIDAZOLE (FLAGYL) IVPB 500 mg (has no administration in time range)  ?lactated ringers bolus 1,000 mL (has no administration in time range)  ?  And  ?lactated ringers bolus 1,000 mL (has no administration in time range)  ?  And  ?lactated ringers bolus 250 mL (has no administration in time range)  ?vancomycin (VANCOREADY) IVPB 1500 mg/300 mL (has no administration in time range)  ?sodium chloride 0.9 % bolus 1,000 mL (1,000 mLs Intravenous New Bag/Given 01/23/2022 1833)  ? ? ?ED Course/ Medical  Decision Making/ A&P ?Clinical Course as of 01/11/2022 2018  ?Wed Jan 13, 2022  ?1836 This is a 61 year old male with a history of lipidemia meningeal metastatic cancer presenting from home in the company of his family wit

## 2022-01-13 NOTE — Progress Notes (Signed)
A consult was received from an ED physician for cefepime and vancomycin per pharmacy dosing.  The patient's profile has been reviewed for ht/wt/allergies/indication/available labs.   ? ?I agree with order for Cefepime 2 g IV that has already been entered.  A one time order has been placed for Vancomycin 1500 mg.  Further antibiotics/pharmacy consults should be ordered by admitting physician if indicated.       ?                ?Royetta Asal, PharmD, BCPS ?Clinical Pharmacist ?[place] ?Please utilize Amion for appropriate phone number to reach the unit pharmacist ([place]) ?01/21/2022 7:19 PM ? ? ?

## 2022-01-13 NOTE — ED Triage Notes (Addendum)
Pt BIB EMS from home, c/o generalized weakness, abdominal pain and SOB x 1 week. Hx stage 4 liver cancer. Reported 60 lb recent weight loss. No known palliative consult noted. 18 g, LAC, received 200 mL saline. ? ? ?

## 2022-01-13 NOTE — Sepsis Progress Note (Signed)
Notified provider of need to order repeat lactic acid. ° °

## 2022-01-13 NOTE — Sepsis Progress Note (Addendum)
Monitoring for the code sepsis protocol.  Message sent via secure chat to bedside RN to enter current weight into Epic. ?

## 2022-01-13 NOTE — ED Notes (Signed)
Pt in and out catheter attempt unsuccessful. ?

## 2022-01-14 ENCOUNTER — Inpatient Hospital Stay (HOSPITAL_COMMUNITY): Payer: Medicaid Other

## 2022-01-14 ENCOUNTER — Inpatient Hospital Stay (HOSPITAL_BASED_OUTPATIENT_CLINIC_OR_DEPARTMENT_OTHER): Payer: Medicaid Other

## 2022-01-14 ENCOUNTER — Encounter (HOSPITAL_COMMUNITY): Payer: Self-pay | Admitting: Internal Medicine

## 2022-01-14 ENCOUNTER — Inpatient Hospital Stay (HOSPITAL_COMMUNITY)
Admit: 2022-01-14 | Discharge: 2022-01-14 | Disposition: A | Discharge status: Home / Self Care | Payer: Medicaid Other | Attending: Internal Medicine | Admitting: Internal Medicine

## 2022-01-14 DIAGNOSIS — I82413 Acute embolism and thrombosis of femoral vein, bilateral: Secondary | ICD-10-CM | POA: Diagnosis not present

## 2022-01-14 DIAGNOSIS — I6350 Cerebral infarction due to unspecified occlusion or stenosis of unspecified cerebral artery: Secondary | ICD-10-CM

## 2022-01-14 DIAGNOSIS — R531 Weakness: Secondary | ICD-10-CM

## 2022-01-14 DIAGNOSIS — G935 Compression of brain: Secondary | ICD-10-CM | POA: Diagnosis not present

## 2022-01-14 DIAGNOSIS — A4101 Sepsis due to Methicillin susceptible Staphylococcus aureus: Secondary | ICD-10-CM | POA: Diagnosis not present

## 2022-01-14 DIAGNOSIS — I639 Cerebral infarction, unspecified: Secondary | ICD-10-CM | POA: Diagnosis not present

## 2022-01-14 DIAGNOSIS — I2699 Other pulmonary embolism without acute cor pulmonale: Secondary | ICD-10-CM | POA: Diagnosis not present

## 2022-01-14 DIAGNOSIS — I82409 Acute embolism and thrombosis of unspecified deep veins of unspecified lower extremity: Secondary | ICD-10-CM

## 2022-01-14 DIAGNOSIS — Z7189 Other specified counseling: Secondary | ICD-10-CM | POA: Diagnosis not present

## 2022-01-14 DIAGNOSIS — I63541 Cerebral infarction due to unspecified occlusion or stenosis of right cerebellar artery: Secondary | ICD-10-CM

## 2022-01-14 DIAGNOSIS — Z515 Encounter for palliative care: Secondary | ICD-10-CM | POA: Diagnosis not present

## 2022-01-14 DIAGNOSIS — E440 Moderate protein-calorie malnutrition: Secondary | ICD-10-CM

## 2022-01-14 DIAGNOSIS — C259 Malignant neoplasm of pancreas, unspecified: Secondary | ICD-10-CM | POA: Diagnosis present

## 2022-01-14 DIAGNOSIS — I2609 Other pulmonary embolism with acute cor pulmonale: Secondary | ICD-10-CM | POA: Diagnosis not present

## 2022-01-14 DIAGNOSIS — G936 Cerebral edema: Secondary | ICD-10-CM | POA: Diagnosis present

## 2022-01-14 DIAGNOSIS — L8990 Pressure ulcer of unspecified site, unspecified stage: Secondary | ICD-10-CM | POA: Diagnosis present

## 2022-01-14 LAB — RAPID URINE DRUG SCREEN, HOSP PERFORMED
Amphetamines: NOT DETECTED
Barbiturates: NOT DETECTED
Benzodiazepines: NOT DETECTED
Cocaine: NOT DETECTED
Opiates: NOT DETECTED
Tetrahydrocannabinol: NOT DETECTED

## 2022-01-14 LAB — GLUCOSE, CAPILLARY
Glucose-Capillary: 124 mg/dL — ABNORMAL HIGH (ref 70–99)
Glucose-Capillary: 104 mg/dL — ABNORMAL HIGH (ref 70–99)
Glucose-Capillary: 106 mg/dL — ABNORMAL HIGH (ref 70–99)
Glucose-Capillary: 97 mg/dL (ref 70–99)
Glucose-Capillary: 98 mg/dL (ref 70–99)
Glucose-Capillary: 117 mg/dL — ABNORMAL HIGH (ref 70–99)
Glucose-Capillary: 109 mg/dL — ABNORMAL HIGH (ref 70–99)
Glucose-Capillary: 131 mg/dL — ABNORMAL HIGH (ref 70–99)

## 2022-01-14 LAB — CBC WITH DIFFERENTIAL/PLATELET
Abs Immature Granulocytes: 0.61 10*3/uL — ABNORMAL HIGH (ref 0.00–0.07)
Basophils Absolute: 0 10*3/uL (ref 0.0–0.1)
Basophils Relative: 0 %
Eosinophils Absolute: 0.3 10*3/uL (ref 0.0–0.5)
Eosinophils Relative: 1 %
HCT: 22.6 % — ABNORMAL LOW (ref 39.0–52.0)
Hemoglobin: 7.5 g/dL — ABNORMAL LOW (ref 13.0–17.0)
Immature Granulocytes: 3 %
Lymphocytes Relative: 6 %
Lymphs Abs: 1.3 10*3/uL (ref 0.7–4.0)
MCH: 28.5 pg (ref 26.0–34.0)
MCHC: 33.2 g/dL (ref 30.0–36.0)
MCV: 85.9 fL (ref 80.0–100.0)
Monocytes Absolute: 1.1 10*3/uL — ABNORMAL HIGH (ref 0.1–1.0)
Monocytes Relative: 5 %
Neutro Abs: 17.6 10*3/uL — ABNORMAL HIGH (ref 1.7–7.7)
Neutrophils Relative %: 85 %
Platelets: 108 10*3/uL — ABNORMAL LOW (ref 150–400)
RBC: 2.63 MIL/uL — ABNORMAL LOW (ref 4.22–5.81)
RDW: 15.8 % — ABNORMAL HIGH (ref 11.5–15.5)
WBC: 21 10*3/uL — ABNORMAL HIGH (ref 4.0–10.5)
nRBC: 0 % (ref 0.0–0.2)

## 2022-01-14 LAB — COMPREHENSIVE METABOLIC PANEL
ALT: 51 U/L — ABNORMAL HIGH (ref 0–44)
AST: 33 U/L (ref 15–41)
Albumin: <1.5 g/dL — ABNORMAL LOW (ref 3.5–5.0)
Alkaline Phosphatase: 220 U/L — ABNORMAL HIGH (ref 38–126)
Anion gap: 8 (ref 5–15)
BUN: 30 mg/dL — ABNORMAL HIGH (ref 6–20)
CO2: 27 mmol/L (ref 22–32)
Calcium: 7.6 mg/dL — ABNORMAL LOW (ref 8.9–10.3)
Chloride: 103 mmol/L (ref 98–111)
Creatinine, Ser: 0.78 mg/dL (ref 0.61–1.24)
GFR, Estimated: >60 mL/min (ref >60)
Glucose, Bld: 107 mg/dL — ABNORMAL HIGH (ref 70–99)
Potassium: 4.1 mmol/L (ref 3.5–5.1)
Sodium: 138 mmol/L (ref 135–145)
Total Bilirubin: 0.8 mg/dL (ref 0.3–1.2)
Total Protein: 5.5 g/dL — ABNORMAL LOW (ref 6.5–8.1)

## 2022-01-14 LAB — URINALYSIS, ROUTINE W REFLEX MICROSCOPIC
Bacteria, UA: NONE SEEN
Bilirubin Urine: NEGATIVE
Glucose, UA: NEGATIVE mg/dL
Hgb urine dipstick: NEGATIVE
Ketones, ur: NEGATIVE mg/dL
Leukocytes,Ua: NEGATIVE
Nitrite: NEGATIVE
Protein, ur: 100 mg/dL — AB
Specific Gravity, Urine: 1.025 (ref 1.005–1.030)
pH: 5 (ref 5.0–8.0)

## 2022-01-14 LAB — ECHOCARDIOGRAM COMPLETE
AV Peak grad: 17.6 mmHg
Ao pk vel: 2.1 m/s
Area-P 1/2: 4.83 cm2
Height: 69 in
S' Lateral: 1.8 cm
Weight: 2507.95 oz

## 2022-01-14 LAB — LACTIC ACID, PLASMA
Lactic Acid, Venous: 2.8 mmol/L (ref 0.5–1.9)
Lactic Acid, Venous: 1.9 mmol/L (ref 0.5–1.9)

## 2022-01-14 LAB — SODIUM
Sodium: 137 mmol/L (ref 135–145)
Sodium: 142 mmol/L (ref 135–145)

## 2022-01-14 LAB — PROTIME-INR
INR: 2.3 — ABNORMAL HIGH (ref 0.8–1.2)
Prothrombin Time: 24.9 seconds — ABNORMAL HIGH (ref 11.4–15.2)

## 2022-01-14 LAB — PHOSPHORUS: Phosphorus: 4.7 mg/dL — ABNORMAL HIGH (ref 2.5–4.6)

## 2022-01-14 LAB — HEPARIN LEVEL (UNFRACTIONATED)
Heparin Unfractionated: <0.1 IU/mL — ABNORMAL LOW (ref 0.30–0.70)
Heparin Unfractionated: <0.1 IU/mL — ABNORMAL LOW (ref 0.30–0.70)

## 2022-01-14 LAB — HIV ANTIBODY (ROUTINE TESTING W REFLEX): HIV Screen 4th Generation wRfx: NONREACTIVE

## 2022-01-14 LAB — HEMOGLOBIN A1C
Hgb A1c MFr Bld: 5.8 % — ABNORMAL HIGH (ref 4.8–5.6)
Mean Plasma Glucose: 119.76 mg/dL

## 2022-01-14 LAB — MAGNESIUM: Magnesium: 1.9 mg/dL (ref 1.7–2.4)

## 2022-01-14 LAB — MRSA NEXT GEN BY PCR, NASAL: MRSA by PCR Next Gen: NOT DETECTED

## 2022-01-14 MED ORDER — HEPARIN (PORCINE) 25000 UT/250ML-% IV SOLN
2500.0000 [IU]/h | INTRAVENOUS | Status: AC
  Administered 2022-01-14: 1300 [IU]/h via INTRAVENOUS
  Administered 2022-01-14: 1500 [IU]/h via INTRAVENOUS
  Administered 2022-01-15: 1950 [IU]/h via INTRAVENOUS
  Administered 2022-01-15: 2100 [IU]/h via INTRAVENOUS
  Administered 2022-01-16: 2500 [IU]/h via INTRAVENOUS
  Filled 2022-01-14 (×5): qty 250

## 2022-01-14 MED ORDER — GABAPENTIN 300 MG PO CAPS: 400.0000 mg | ORAL_CAPSULE | Freq: Two times a day (BID) | ORAL | Status: DC | PRN

## 2022-01-14 MED ORDER — MELATONIN 3 MG PO TABS: 3.0000 mg | ORAL_TABLET | Freq: Every evening | ORAL | Status: DC | PRN

## 2022-01-14 MED ORDER — VITAMIN A 3 MG (10000 UNIT) PO CAPS
20000.0000 [IU] | ORAL_CAPSULE | Freq: Every day | ORAL | Status: DC
  Administered 2022-01-14: 20000 [IU]
  Filled 2022-01-14 (×2): qty 2

## 2022-01-14 MED ORDER — OSMOLITE 1.5 CAL PO LIQD
1000.0000 mL | ORAL | Status: DC
  Administered 2022-01-14 – 2022-01-19 (×4): 1000 mL
  Filled 2022-01-14: qty 1000

## 2022-01-14 MED ORDER — METRONIDAZOLE 500 MG/100ML IV SOLN
500.0000 mg | Freq: Two times a day (BID) | INTRAVENOUS | Status: DC
  Administered 2022-01-14 – 2022-01-16 (×5): 500 mg via INTRAVENOUS
  Filled 2022-01-14 (×5): qty 100

## 2022-01-14 MED ORDER — CHLORHEXIDINE GLUCONATE 0.12 % MT SOLN
15.0000 mL | Freq: Two times a day (BID) | OROMUCOSAL | Status: DC
  Administered 2022-01-14 – 2022-01-16 (×6): 15 mL via OROMUCOSAL
  Filled 2022-01-14 (×4): qty 15

## 2022-01-14 MED ORDER — PROSOURCE TF PO LIQD
90.0000 mL | Freq: Two times a day (BID) | ORAL | Status: DC
  Administered 2022-01-14 – 2022-01-19 (×11): 90 mL
  Filled 2022-01-14 (×10): qty 90

## 2022-01-14 MED ORDER — IOHEXOL 350 MG/ML SOLN
100.0000 mL | Freq: Once | INTRAVENOUS | Status: AC | PRN
  Administered 2022-01-14: 100 mL via INTRAVENOUS

## 2022-01-14 MED ORDER — ACETAMINOPHEN 325 MG PO TABS: 650.0000 mg | ORAL_TABLET | Freq: Four times a day (QID) | ORAL | Status: DC | PRN

## 2022-01-14 MED ORDER — SODIUM CHLORIDE 0.9% FLUSH: 10.0000 mL | INTRAVENOUS | Status: DC | PRN

## 2022-01-14 MED ORDER — INSULIN ASPART 100 UNIT/ML IJ SOLN
0.0000 [IU] | INTRAMUSCULAR | Status: DC
  Administered 2022-01-14 – 2022-01-15 (×3): 1 [IU] via SUBCUTANEOUS
  Administered 2022-01-15 (×2): 2 [IU] via SUBCUTANEOUS
  Administered 2022-01-15: 1 [IU] via SUBCUTANEOUS
  Administered 2022-01-15 – 2022-01-17 (×7): 2 [IU] via SUBCUTANEOUS
  Administered 2022-01-17: 1 [IU] via SUBCUTANEOUS
  Administered 2022-01-17 – 2022-01-18 (×4): 2 [IU] via SUBCUTANEOUS
  Administered 2022-01-18: 1 [IU] via SUBCUTANEOUS
  Administered 2022-01-18: 2 [IU] via SUBCUTANEOUS
  Administered 2022-01-19: 5 [IU] via SUBCUTANEOUS
  Administered 2022-01-19 (×2): 2 [IU] via SUBCUTANEOUS
  Administered 2022-01-19 (×4): 3 [IU] via SUBCUTANEOUS

## 2022-01-14 MED ORDER — FREE WATER
100.0000 mL | Status: DC
  Administered 2022-01-14 – 2022-01-15 (×6): 100 mL

## 2022-01-14 MED ORDER — POLYETHYLENE GLYCOL 3350 17 G PO PACK: 17.0000 g | PACK | Freq: Every day | ORAL | Status: DC | PRN

## 2022-01-14 MED ORDER — LACTATED RINGERS IV SOLN: INTRAVENOUS | Status: AC

## 2022-01-14 MED ORDER — POTASSIUM & SODIUM PHOSPHATES 280-160-250 MG PO PACK
1.0000 | PACK | Freq: Three times a day (TID) | ORAL | Status: DC
  Administered 2022-01-14 – 2022-01-15 (×3): 1
  Filled 2022-01-14 (×3): qty 1

## 2022-01-14 MED ORDER — STROKE: EARLY STAGES OF RECOVERY BOOK
Freq: Once | Status: AC
  Filled 2022-01-14: qty 1

## 2022-01-14 MED ORDER — SODIUM CHLORIDE 0.9 % IV SOLN
2.0000 g | Freq: Three times a day (TID) | INTRAVENOUS | Status: DC
  Administered 2022-01-14 – 2022-01-16 (×7): 2 g via INTRAVENOUS
  Filled 2022-01-14 (×7): qty 12.5

## 2022-01-14 MED ORDER — HEPARIN BOLUS VIA INFUSION
2000.0000 [IU] | Freq: Once | INTRAVENOUS | Status: AC
  Administered 2022-01-14: 2000 [IU] via INTRAVENOUS
  Filled 2022-01-14: qty 2000

## 2022-01-14 MED ORDER — IOHEXOL 350 MG/ML SOLN: 75.0000 mL | Freq: Once | INTRAVENOUS | Status: DC | PRN

## 2022-01-14 MED ORDER — SODIUM CHLORIDE (PF) 0.9 % IJ SOLN
INTRAMUSCULAR | Status: AC
  Filled 2022-01-14: qty 50

## 2022-01-14 MED ORDER — OXYCODONE HCL 5 MG PO TABS
5.0000 mg | ORAL_TABLET | Freq: Four times a day (QID) | ORAL | Status: DC | PRN
  Administered 2022-01-14 – 2022-01-19 (×7): 5 mg
  Filled 2022-01-14 (×6): qty 1

## 2022-01-14 MED ORDER — OXYCODONE HCL 5 MG PO TABS
5.0000 mg | ORAL_TABLET | Freq: Four times a day (QID) | ORAL | Status: DC | PRN
  Filled 2022-01-14: qty 1

## 2022-01-14 MED ORDER — LORAZEPAM 2 MG/ML IJ SOLN
2.0000 mg | Freq: Four times a day (QID) | INTRAMUSCULAR | Status: DC | PRN
  Filled 2022-01-14: qty 1

## 2022-01-14 MED ORDER — SODIUM CHLORIDE 3 % IV SOLN
INTRAVENOUS | Status: DC
  Filled 2022-01-14 (×6): qty 500

## 2022-01-14 MED ORDER — VANCOMYCIN HCL 1500 MG/300ML IV SOLN
1500.0000 mg | INTRAVENOUS | Status: DC
  Administered 2022-01-14 – 2022-01-16 (×3): 1500 mg via INTRAVENOUS
  Filled 2022-01-14 (×3): qty 300

## 2022-01-14 MED ORDER — VITAL HIGH PROTEIN PO LIQD: 1000.0000 mL | ORAL | Status: DC

## 2022-01-14 MED ORDER — ONDANSETRON HCL 4 MG/2ML IJ SOLN: 4.0000 mg | Freq: Four times a day (QID) | INTRAMUSCULAR | Status: DC | PRN

## 2022-01-14 MED ORDER — HYDROMORPHONE HCL 1 MG/ML IJ SOLN
0.5000 mg | INTRAMUSCULAR | Status: DC | PRN
  Filled 2022-01-14: qty 1

## 2022-01-14 MED ORDER — GABAPENTIN 250 MG/5ML PO SOLN
400.0000 mg | Freq: Two times a day (BID) | ORAL | Status: DC | PRN
  Administered 2022-01-14: 400 mg
  Filled 2022-01-14 (×2): qty 8

## 2022-01-14 MED ORDER — THIAMINE HCL 100 MG PO TABS
100.0000 mg | ORAL_TABLET | Freq: Every day | ORAL | Status: DC
  Administered 2022-01-14 – 2022-01-19 (×6): 100 mg
  Filled 2022-01-14 (×6): qty 1

## 2022-01-14 MED ORDER — ORAL CARE MOUTH RINSE
15.0000 mL | Freq: Two times a day (BID) | OROMUCOSAL | Status: DC
  Administered 2022-01-14 – 2022-01-16 (×6): 15 mL via OROMUCOSAL

## 2022-01-14 MED ORDER — CHLORPROMAZINE HCL 25 MG PO TABS
25.0000 mg | ORAL_TABLET | Freq: Three times a day (TID) | ORAL | Status: DC
  Administered 2022-01-14 – 2022-01-19 (×16): 25 mg
  Filled 2022-01-14 (×17): qty 1

## 2022-01-14 NOTE — Progress Notes (Addendum)
PT Cancellation Note ? ?Patient Details ?Name: Michael Kemp ?MRN: [id number] ?DOB: 02/13/1961 ? ? ?Cancelled Treatment:    Reason Eval/Treat Not Completed: Patient at procedure or test/unavailable (pt at CT.  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 Per protocol we will wait 24 hours from initiation of heparin (4:31 am 01/14/22) to start mobility. Will follow.  ? ? ?Blondell Reveal Kistler PT 01/14/2022  ?Acute Rehabilitation Services ?Pager [phone] ?Office [phone] ? ?

## 2022-01-14 NOTE — Progress Notes (Signed)
Pharmacy Antibiotic Note ? ?[name] is a 61 y.o. male admitted on 01/11/2022 with sepsis.  Pharmacy has been consulted for Vancomycin & Cefepime dosing. ? ?Plan: ?Cefepime 2gm IV q8h ?Vancomycin '1500mg'$  IV q24h to target AUC 400-550 ?Check Vancomycin levels at steady state ?Monitor renal function and cx data  ? ?Height: '5\' 9"'$  (175.3 cm) ?Weight: 71.1 kg (156 lb 12 oz) ?IBW/kg (Calculated) : 70.7 ? ?Temp (24hrs), Avg:96.7 ?F (35.9 ?C), Min:95.1 ?F (35.1 ?C), Max:98.3 ?F (36.8 ?C) ? ?Recent Labs  ?Lab 01/03/2022 ?1739 01/24/2022 ?1837 01/25/2022 ?2037 01/29/2022 ?2325  ?WBC 30.1*  --   --   --   ?CREATININE 1.11  --   --   --   ?LATICACIDVEN  --  3.0* 3.1* 2.8*  ?  ?Estimated Creatinine Clearance: 70.8 mL/min (by C-G formula based on SCr of 1.11 mg/dL).   ? ?Allergies  ?Allergen Reactions  ? Lisinopril Anaphylaxis  ? Amoxicillin Nausea And Vomiting  ? Aripiprazole Other (See Comments)  ?  Made patient feel "really low" per daughter at bedside  ? Olmesartan Other (See Comments)  ?  "bothered stomach"  ? Shellfish Allergy Nausea Only  ?  "Bad headache"  ? ? ?Antimicrobials this admission: ?4/13 Vancomycin >>  ?4/13 Cefepime >>  ?4/13 Flagyl >> ? ?Dose adjustments this admission: ? ?Microbiology results: ?4/12 BCx:  ? ?Thank you for allowing pharmacy to be a part of this patient?s care. ? ?Netta Cedars PharmD ?01/14/2022 2:34 AM ? ?

## 2022-01-14 NOTE — Progress Notes (Signed)
SLP Cancellation Note ? ?Patient Details ?Name: Michael Kemp ?MRN: [id number] ?DOB: [birth year] ? ? ?Cancelled treatment:       Reason Eval/Treat Not Completed: Other (comment);Patient at procedure or test/unavailable (pt at CT at this time, will continue efforts) ?Kathleen Lime, MS CCC SLP ?Acute Rehab Services ?Office [phone] ?Pager [phone] ? ? ?Michael Kemp ?01/14/2022, 12:06 PM ?

## 2022-01-14 NOTE — Progress Notes (Addendum)
PHARMACY NOTE:  ANTIMICROBIAL RENAL DOSAGE ADJUSTMENT ? ?Current antimicrobial regimen includes a mismatch between antimicrobial dosage and estimated renal function.  As per policy approved by the Pharmacy & Therapeutics and Medical Executive Committees, the antimicrobial dosage will be adjusted accordingly. ? ?Current antimicrobial dosage:  Cefepime 2gm IV q24h ? ?Indication: sepsis ? ?Renal Function: ? ?CrCl cannot be calculated (Unknown ideal weight.).  GFR>60, Scr 1.1 ?'[]'$      On intermittent HD, scheduled: ?'[]'$      On CRRT ?   ?Antimicrobial dosage has been changed to:  Cefepime 2gm IV q8h ? ?Additional comments: ? ? ?Thank you for allowing pharmacy to be a part of this patient's care. ? ?Netta Cedars, [facility] ?01/14/2022 12:19 AM ? ? ?   ?

## 2022-01-14 NOTE — Progress Notes (Signed)
Initial Nutrition Assessment ? ?DOCUMENTATION CODES:  ? ?Non-severe (moderate) malnutrition in context of chronic illness ? ?INTERVENTION:  ?- will order TF regimen: Osmolite 1.5 @ 35 ml/hr to advance by 10 ml every 8 hours to reach goal rate of 65 ml/hr x24 hours/day with 90 ml Prosource TF BID and 100 ml free water every 3 hours. ? ?- at goal rate, this regimen will provide 2500 kcal, 142 grams protein, and 1989 ml free water ml free water. ? ?- will adjust TF regimen once diet able to be advanced.  ? ? ?NUTRITION DIAGNOSIS:  ? ?Moderate Malnutrition related to chronic illness, cancer and cancer related treatments as evidenced by mild muscle depletion, moderate muscle depletion, moderate fat depletion. ? ?GOAL:  ? ?Patient will meet greater than or equal to 90% of their needs ? ?MONITOR:  ? ?TF tolerance, Labs, Weight trends, Skin ? ?REASON FOR ASSESSMENT:  ? ?Consult ?Enteral/tube feeding initiation and management ? ?ASSESSMENT:  ? ?61 y.o. male with medical history of pancreatic adenocarcinoma with mets to liver and recent findings of leptomeningeal spread on MRI brain/spine at [facility] (12/27/21), type 2 DM, failure to thrive in an adult, HTN, GERD. He presented to the ED from home due to sudden onset of confusion and abdominal pain. He was admitted with diagnosis of sepsis. ? ?Patient to MRI this AM and then to CT. Able to see patient between these two imaging sessions.  ? ?Patient laying in bed with his sister and later his daughter at bedside. Patient lives with his daughter and son-in-law and daughter provided all information related to tube feeding via PEG.  ? ?Patient receives Osmolite 1.5 @ 65 ml/hr from 1730-0530 (12 hours) daily. This regimen provides 1170 kcal, 49 grams protein, and 594 ml free water. ? ?Recently there was a plan to increase rate to 75 ml/hr x12 hours/day. ? ?Further nutrition-related information unable to be obtained at this time due to medical course and needs. ? ?SLP was unable to work  with patient this afternoon d/t need for CT.  ? ?Weight today is 157 lb and weight on 12/28/21 at [facility] was 146 lb.  ? ? ?Labs reviewed; CBGs: 124, 104, 106, 97, 98 mg/dl, BUN: 30 mg/dl, Ca: 7.6 mg/dl, Phos: 4.7 mg/dl, Alk Phos elevated.  ?Medications reviewed; sliding scale novolog.  ?IVF; LR @ 50 ml/hr.  ?  ? ?NUTRITION - FOCUSED PHYSICAL EXAM: ? ?Flowsheet Row Most Recent Value  ?Orbital Region Moderate depletion  ?Upper Arm Region Mild depletion  ?Thoracic and Lumbar Region Unable to assess  ?Buccal Region Severe depletion  ?Temple Region Severe depletion  ?Clavicle Bone Region Moderate depletion  ?Clavicle and Acromion Bone Region Moderate depletion  ?Scapular Bone Region Unable to assess  ?Dorsal Hand No depletion  ?Patellar Region Mild depletion  ?Anterior Thigh Region Mild depletion  ?Posterior Calf Region Mild depletion  ?Edema (RD Assessment) Mild  [BLE]  ?Hair Reviewed  ?Eyes Reviewed  ?Mouth Reviewed  Lennox Pippins dry tongue]  ?Skin Reviewed  ?Nails Reviewed  ? ?  ? ? ?Diet Order:   ?Diet Order   ? ?       ?  Diet NPO time specified  Diet effective now       ?  ? ?  ?  ? ?  ? ? ?EDUCATION NEEDS:  ? ?No education needs have been identified at this time ? ?Skin:  Skin Assessment: Skin Integrity Issues: ?Skin Integrity Issues:: Stage II ?Stage II: bilateral coccyx ? ?Last BM:  PTA/unknown ? ?  Height:  ? ?Ht Readings from Last 1 Encounters:  ?01/14/22 5' 9"  (1.753 m)  ? ? ?Weight:  ? ?Wt Readings from Last 1 Encounters:  ?01/14/22 71.1 kg  ? ? ? ?BMI:  Body mass index is 23.15 kg/m?. ? ?Estimated Nutritional Needs:  ?Kcal:  2500-2700 kcal ?Protein:  125-140 grams ?Fluid:  >/= 2.5 L/day ? ? ? ? ?Jarome Matin, MS, RD, LDN ?Registered Dietitian II ?Inpatient Clinical Nutrition ?RD pager # and on-call/weekend pager # available in [place]  ? ?

## 2022-01-14 NOTE — Progress Notes (Signed)
Telestroke was activated for this patient due to acute strokes on MRI, LKW 24 hrs ago. I called Dr. Benny Lennert who said she did not call a telestroke, but rather meant to call a neurology consult, and had already d/w Dr. Lorrin Goodell, [facility] neurohospitalist. Stroke code canceled. ? ?Su Monks, MD ?[facility] ?[phone] ? ?If 7pm- 7am, please page neurology on call as listed in [place]. ? ?

## 2022-01-14 NOTE — Evaluation (Signed)
SLP Cancellation Note ? ?Patient Details ?Name: Michael Kemp ?MRN: [id number] ?DOB: [birth year] ? ? ?Cancelled treatment:       Reason Eval/Treat Not Completed: Other (comment);Medical issues which prohibited therapy (pt complains of dizziness, RN informed, wet gurly voice noted without pt ability to cough and expectorate; will continue efforts) ? ? ?[name] ?01/14/2022, 9:06 AM ? ?Kathleen Lime, MS CCC SLP ?Acute Rehab Services ?Office [phone] ?Pager [phone] ? ? ?

## 2022-01-14 NOTE — Consult Note (Signed)
WOC Nurse Consult Note: ?Patient receiving care in [facility] ICU 1239. Primary RN, Hope, at bedside and spoke with me about the consult for the stage 2 PIs.  ?Reason for Consult: stage 2 sacral wound ?Measurements and locations are on the flowsheet--POA. Hope verified she has observed the areas and they are in fact stage 2 PIs, and that they are covered with foam dressing.  No further needs identified. The Standing Skin Care Order Set can be independently enacted by the primary RN to treat these areas. ? ?Monitor the wound area(s) for worsening of condition such as: ?Signs/symptoms of infection,  ?Increase in size,  ?Development of or worsening of odor, ?Development of pain, or increased pain at the affected locations.  Notify the medical team if any of these develop. ? ?Thank you for the consult.  Discussed plan of care with the bedside nurse.  [place] nurse will not follow at this time.  Please re-consult the [place] team if needed. ? ?Val Riles, RN, MSN, CWOCN, CNS-BC, pager [phone]  ?

## 2022-01-14 NOTE — Consult Note (Signed)
? ?NAME:  Michael Kemp, MRN:  [id number], DOB:  1960-11-04, LOS: 1 ?ADMISSION DATE:  01/21/2022, CONSULTATION DATE:  01/14/2022 ?REFERRING MD:  Dr. Benny Lennert, CHIEF COMPLAINT:  Code stroke   ? ?History of Present Illness:  ?Michael Kemp is a 61 y.o. male with a PMH significant for pancreatic adenocarcinoma with mets to the liver and recent leptomeningeal spear on MRI brain/spine at [facility], diabetes, HTN, GERD, and failure to thrive who presented to the [facility] ED for sudden onset confusion.  ? ?On ED arrival patient was seen with stable vital signs but new underlying confusion persist. Labwork significant for leukocytosis, anemia, AKI, and elevated liver enzymes.  CT angio chest/abdomen/pelvis obtained on arrival and revealed bilateral pulmonary emboli with CT evidence of right heart strain as well as known distal pancreatic tail mass with mild ascites and splenic venous occlusion of chronic nature.  Head CT on admit negative ? 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 ?Given multiple medical conditions with new acute strokes neurology and critical care was consulted to assist in further care. Patient will be transferred to [facility] for further neurologic care.  ? ?Pertinent  Medical History  ?Pancreatic adenocarcinoma with mets to the liver and recent leptomeningeal spear on MRI brain/spine at [facility], diabetes, HTN, GERD, and failure to thrive ? 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 PCCM consulted ? ?Interim History / Subjective:  ?As above  ? ?Objective   ?Blood pressure 116/85, pulse (!) 111, temperature 98.1 ?F (36.7 ?C), temperature source Oral, resp. rate (!) 24, height '5\' 9"'$  (1.753 m), weight 71.1 kg, SpO2 92 %. ?   ?   ? ?Intake/Output Summary (Last 24 hours) at 01/14/2022 1439 ?Last data filed at 01/14/2022 1407 ?Gross per 24 hour  ?Intake 1909.74 ml  ?Output 400 ml  ?Net 1509.74 ml  ? ?Filed Weights  ? 01/12/2022 1958 01/14/22 0100  ?Weight: 66.2 kg 71.1 kg  ? ? ?Examination: ?General: Acute on chronically ill appearing middle aged male lying in bed, in NAD ?HEENT: [place]/AT, MM pink/moist, PERRL,  ?Neuro: Alert and oriented, non-focal  ?CV: s1s2 Tachycardia, regular rate and rhythm, no murmur, rubs, or gallops,  ?PULM:  Diminished breath sounds bilaterally, no increased work of breathing, no added breath sounds  ?GI: soft, bowel sounds active in all 4 quadrants, non-tender, non-distended, tolerating TF ?Extremities: warm/dry, bilateral upper extremity edema  ?Skin: no rashes or lesions ? ?Resolved Hospital Problem list   ? ? ?Assessment & Plan:  ?Severe sepsis on admit Sepsis ?-Patient was seen with tachypnea, tachycardia, leukocytosis, and elevated lactic with acute concern for sepsis on arrival  ?-Question intraabdominal process vs pneumonia  ?P: ?Continue supplemental oxygen for sat goal > 92 ?Follow cultures  ?Continue broad spectrum antibiotics  ?MA goal P< 65  ?Monitor urine output ?Capillary refill: ? ?Acute bilateral PE with CT evidence of right heart strain  ?Bilateral acute lower extremity  DVT  ?P: ?Continue Heparin drip, however patient is at risk for intracranial bleed given new strokes  ?Monitor neuro exam closely  ?Monitor for signs of bleeding  ?May need to consider IVC filter  ? 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 ?Severe protein calorie malnutrition  ?Failure to thrive  ?P: ?Supplement protein as able  ?Mobilize as able  ?PT/OT as able ? ?Stage II sacral pressure injury  ?P: ?WOC consult  ?Pressure alleviating devices  ?Frequent turns  ? ?Elevated INR  ?Thrombocytopaenia  ?-Platelet count dropped to 108 from 151 the day prior  ?P: ?High risk for intracranial bleed given need for anticoagulation in the setting of acute PE and DVT  ?If platelt count drops blow 100 may need to consider holding/stopping heparin ?May need to consider IVC filter  ? ?Type 2 diabetes  ?-A1C 5.3 ?P: ?SSI  ?CBG goal 140-180 ? ?Best Practice (right click and "Reselect all SmartList Selections" daily)  ? ?Diet/type: NPO ?DVT prophylaxis: systemic heparin ?GI prophylaxis: PPI ?Lines: N/A ?Foley:  N/A ?Code Status:  full code ?Last date of multidisciplinary goals of care discussion: Per neurology and palliative care family wishes to proceed with full scope of care and to remain full code.  ? ?Labs   ?CBC: ?Recent Labs  ?Lab 01/31/2022 ?1739 01/14/22 ?0037  ?WBC 30.1*  21.0*  ?NEUTROABS 27.1* 17.6*  ?HGB 8.8* 7.5*  ?HCT 26.9* 22.6*  ?MCV 85.9 85.9  ?PLT 151 108*  ? ? ?Basic Metabolic Panel: ?Recent Labs  ?Lab 01/17/2022 ?1739 01/14/22 ?0488  ?NA 136 138  ?K 4.2 4.1  ?CL 100 103  ?CO2 27 27  ?GLUCOSE 142* 107*  ?BUN 29* 30*  ?CREATININE 1.11 0.78  ?CALCIUM 8.1* 7.6*  ?MG  --  1.9  ?PHOS  --  4.7*  ? ?GFR: ?Estimated Creatinine Clearance: 98.2 mL/min (by C-G formula based on SCr of 0.78 mg/dL). ?Recent Labs  ?Lab 01/24/2022 ?1739 01/08/2022 ?1837 01/14/2022 ?2037 01/09/2022 ?2325 01/14/22 ?0225 01/14/22 ?8916  ?WBC 30.1*  --   --   --   --  21.0*  ?LATICACIDVEN  --  3.0* 3.1* 2.8* 1.9  --   ? ? ?Liver Function Tests: ?Recent Labs  ?Lab 01/19/2022 ?1739 01/14/22 ?9450  ?AST 41 33  ?ALT 64* 51*  ?ALKPHOS 281* 220*  ?BILITOT 1.4* 0.8  ?PROT 6.4* 5.5*  ?ALBUMIN 1.6* <1.5*  ? ?No results for input(s): LIPASE, AMYLASE in the last 168 hours. ?Recent Labs  ?Lab 01/16/2022 ?1739  ?AMMONIA 25  ? ? ?ABG ?No results found for: PHART, PCO2ART, PO2ART, HCO3, TCO2, ACIDBASEDEF, O2SAT  ? ?Coagulation Profile: ?Recent Labs  ?Lab 01/14/22 ?0416  ?INR 2.3*  ? ? ?Cardiac Enzymes: ?No results for input(s):  CKTOTAL, CKMB, CKMBINDEX, TROPONINI in the last 168 hours. ? ?HbA1C: ?Hgb A1c MFr Bld  ?Date/Time Value Ref Range Status  ?01/14/2022 04:16 AM 5.8 (H) 4.8 - 5.6 % Final  ?  Comment:  ?  (NOTE) ?Pre diabetes:          5.7%-6.4% ? ?Diabetes:              >6.4% ? ?Glycemic control for   <7.0% ?adults with diabetes ?  ? ? ?CBG: ?Recent Labs  ?Lab 01/14/22 ?0110 01/14/22 ?0408 01/14/22 ?0803 01/14/22 ?1140 01/14/22 ?1315  ?GLUCAP 124* 104* 106* 97 98  ? ? ?Review of Systems:   ?Please see the history of present illness. All other systems reviewed and are negative  ? ?Past Medical History:  ?He,  has a past medical history of Hypertension.  ? ?Surgical History:  ?History reviewed. No pertinent surgical history.  ? ?Social History:  ? reports that he has never smoked. He does not have any smokeless tobacco history on  file. He reports that he does not drink alcohol and does not use drugs.  ? ?Family History:  ?His family history is not on file.  ? ?Allergies ?Allergies  ?Allergen Reactions  ? Lisinopril Anaphylaxis  ? Amoxicillin

## 2022-01-14 NOTE — Progress Notes (Signed)
ANTICOAGULATION CONSULT NOTE - Initial Consult ? ?Pharmacy Consult for Heparin ?Indication: pulmonary embolus ? ?Allergies  ?Allergen Reactions  ? Lisinopril Anaphylaxis  ? Amoxicillin Nausea And Vomiting  ? Aripiprazole Other (See Comments)  ?  Made patient feel "really low" per daughter at bedside  ? Olmesartan Other (See Comments)  ?  "bothered stomach"  ? Shellfish Allergy Nausea Only  ?  "Bad headache"  ? ? ?Patient Measurements: ?Height: '5\' 9"'$  (175.3 cm) ?Weight: 71.1 kg (156 lb 12 oz) ?IBW/kg (Calculated) : 70.7 ?Heparin Dosing Weight: TBW ? ?Vital Signs: ?Temp: 95.1 ?F (35.1 ?C) (04/13 0100) ?Temp Source: Rectal (04/13 0100) ?BP: 131/88 (04/13 0300) ?Pulse Rate: 86 (04/13 0300) ? ?Labs: ?Recent Labs  ?  01/12/2022 ?1739  ?HGB 8.8*  ?HCT 26.9*  ?PLT 151  ?CREATININE 1.11  ? ? ?Estimated Creatinine Clearance: 70.8 mL/min (by C-G formula based on SCr of 1.11 mg/dL). ? ? ?Medical History: ?Past Medical History:  ?Diagnosis Date  ? Hypertension   ? ? ?Medications:  ?Infusions:  ? ceFEPime (MAXIPIME) IV    ? heparin    ? lactated ringers 50 mL/hr at 01/14/22 0300  ? metronidazole    ? vancomycin    ? ? ?Assessment: ?61 yo M with metastatic pancreatic cancer admitted with sepsis and acute metabolic encephalopathy.   ?Chest CT + bilateral PE with right heart strain noted.  ? ?Patient received dose of Lovenox '40mg'$  for VTE px at 0133.  Per PTA med list, he is not on anticoagulant, however does take Vit K '5mg'$  once weekly.  ? ?Baseline CBC- Hg low at 8.8, pltc at low end of normal range at 151.  These findings are consistent with labs from recent [facility] oncology visit notes.  ? ?Goal of Therapy:  ?Heparin level 0.3-0.7 units/ml ?Monitor platelets by anticoagulation protocol: Yes ?  ?Plan:  ?Heparin 2000 units IV bolus x1  ?Heparin continuous infusion at 1300 units/hr ?Check heparin level in 6h ?Daily heparin level & CBC while on heparin ? ?Netta Cedars PharmD ?01/14/2022,3:26 AM ? ? ?

## 2022-01-14 NOTE — Progress Notes (Signed)
ANTICOAGULATION CONSULT NOTE ? ?Pharmacy Consult for Heparin ?Indication: pulmonary embolus ? ?Allergies  ?Allergen Reactions  ? Lisinopril Anaphylaxis  ? Amoxicillin Nausea And Vomiting  ? Aripiprazole Other (See Comments)  ?  Made patient feel "really low" per daughter at bedside  ? Olmesartan Other (See Comments)  ?  "bothered stomach"  ? Shellfish Allergy Nausea Only  ?  "Bad headache"  ? ? ?Patient Measurements: ?Height: '5\' 9"'$  (175.3 cm) ?Weight: 71.1 kg (156 lb 12 oz) ?IBW/kg (Calculated) : 70.7 ?Heparin Dosing Weight: TBW ? ?Vital Signs: ?Temp: 98.6 ?F (37 ?C) (04/13 1600) ?Temp Source: Oral (04/13 1600) ?BP: 118/84 (04/13 1900) ?Pulse Rate: 101 (04/13 1900) ? ?Labs: ?Recent Labs  ?  01/12/2022 ?1739 01/14/22 ?0416 01/14/22 ?9233 01/14/22 ?1039 01/14/22 ?1820  ?HGB 8.8*  --  7.5*  --   --   ?HCT 26.9*  --  22.6*  --   --   ?PLT 151  --  108*  --   --   ?LABPROT  --  24.9*  --   --   --   ?INR  --  2.3*  --   --   --   ?HEPARINUNFRC  --   --   --  <0.10* <0.10*  ?CREATININE 1.11  --  0.78  --   --   ? ? ? ?Estimated Creatinine Clearance: 98.2 mL/min (by C-G formula based on SCr of 0.78 mg/dL). ? ? ?Assessment: ?61 yo M with metastatic pancreatic cancer admitted with sepsis and acute metabolic encephalopathy.   ?Pt on heparin for bilateral PE with right heart strain and acute bilateral DVTs. Pt also with large right cerebellar stroke. ? ?Dr. Lorrin Goodell and Dr. Benny Lennert in agreement with heparin due to large clot burden even with new large stroke. Request no bolus and low goal. ? ?Heparin level remains undetectable on infusion at 1500 units/hr. No issues with line or bleeding reported per RN. ? ?Goal of Therapy:  ?Heparin level 0.3-0.5 units/hr ?Monitor platelets by anticoagulation protocol: Yes ?  ?Plan:   ?Increase heparin to 1750 units/hr ?Check heparin level in 6 hr ? ?Sherlon Handing, PharmD, BCPS ?Please see amion for complete clinical pharmacist phone list ?01/14/2022 7:35 PM ? ? ? ?

## 2022-01-14 NOTE — Evaluation (Signed)
SLP Cancellation Note ? ?Patient Details ?Name: Michael Kemp ?MRN: [id number] ?DOB: 1960-12-28 ? ? ?Cancelled treatment:       Reason Eval/Treat Not Completed: Other (comment) (pt being transferred to [facility] for Stroke work up per [employer]) ? ?Kathleen Lime, MS CCC SLP ?Acute Rehab Services ?Office [phone] ?Pager [phone] ? ?[name] ?01/14/2022, 2:50 PM ? ? ? ?

## 2022-01-14 NOTE — Progress Notes (Signed)
Patient has been unable to void this shift; Bladder scan showed 550 cc urine in bladder. In-and-out cath done per protocol with 400 cc of amber/bloody output with small clots. Dr Benny Lennert notified by this RN and orders received for a UA. MD is unable to stop IV heparin at this time d/t scattered clots. This RN will continue to carefully monitor pt for symptoms of bleeding.  ?

## 2022-01-14 NOTE — Progress Notes (Signed)
This RN notified MRI that pt has an accessed port. Per MRI technician, pt may still go to MRI as planned as there are interventions they can implement to protect access.  ?

## 2022-01-14 NOTE — Plan of Care (Signed)
This RN will continue to monitor patient's progression of care plan.  

## 2022-01-14 NOTE — Consult Note (Signed)
[place] Nurse Consult Note: ?Attempted to eval patient the what is documented as a stage 2 PI to the sacrum, however, the patient is in a procedure now and due to receive another one immediately afterward. Will attempt later today. ?Val Riles, RN, MSN, CWOCN, CNS-BC, pager [phone]  ?

## 2022-01-14 NOTE — Consult Note (Signed)
? ?                                                                                ?Consultation Note ?Date: 01/14/2022  ? ?Patient Name: Michael Kemp  ?DOB: 1961-02-12  MRN: [id number]  Age / Sex: 61 y.o., male  ?PCP: Lorelee Market, MD ?Referring Physician: Karie Kirks, DO ? ?Reason for Consultation: Establishing goals of care ? ?HPI/Patient Profile: 61 y.o. male admitted on 01/10/2022   ? Michael Kemp is a 61 y.o. male with medical history significant for pancreatic adenocarcinoma with mets to liver and recent findings of leptomeningeal spread on MRI brain/spine at [facility](12/27/21), DM2, failure to thrive in an adult, HTN, GERD who presented to [facility] ED from home due to sudden onset confusion.  ? ?Clinical Assessment and Goals of Care: ? Patient admitted to hospital medicine service, CT with PE. Patient to also go for MRI brain later on today.  ? ?A palliative consult has been consulted for goals of care discussions.  ? ?Chart reviewed. [facility] palliative care records noted. Patient was given thorazine and gabapentin for hiccups and for goals of care discussions, full code, full scope care was discussed.  ? ?Patient is resting in bed. He becomes short of breath while talking.  ? ?Palliative medicine is specialized medical care for people living with serious illness. It focuses on providing relief from the symptoms and stress of a serious illness. The goal is to improve quality of life for both the patient and the family. ?Goals of care: Broad aims of medical therapy in relation to the patient's values and preferences. Our aim is to provide medical care aimed at enabling patients to achieve the goals that matter most to them, given the circumstances of their particular medical situation and their constraints.  ? ?Patient states he defers his decisions to his daughter. Call placed and discussed with daughter Michael Kemp. Discussed with her frankly but compassionately about the patient's serious and incurable illness. Code  status and goals of care discussions undertaken.  ? ?NEXT OF KIN ? Daughter Michael Kemp [phone].   ? ?SUMMARY OF RECOMMENDATIONS   ? Full code, full scope care.  ?Continue goals of care discussions based on hospital course.  ? ?Code Status/Advance Care Planning: ?Full code ? ? ?Symptom Management:  ? ? ?Palliative Prophylaxis:  ?Delirium Protocol ? ?Additional Recommendations (Limitations, Scope, Preferences): ?Full Scope Treatment ? ?Psycho-social/Spiritual:  ?Desire for further Chaplaincy support:yes ?Additional Recommendations: Caregiving  Support/Resources ? ?Prognosis:  ?Unable to determine ? ?Discharge Planning: To Be Determined  ? ?  ? ?Primary Diagnoses: ?Present on Admission: ? Sepsis ([place]) ? ? ?I have reviewed the medical record, interviewed the patient and family, and examined the patient. The following aspects are pertinent. ? ?Past Medical History:  ?Diagnosis Date  ? Hypertension   ? ?Social History  ? ?Socioeconomic History  ? Marital status: Single  ?  Spouse name: Not on file  ? Number of children: Not on file  ? Years of education: Not on file  ? Highest education level: Not on file  ?Occupational History  ? Not on file  ?Tobacco Use  ? Smoking status: Never  ?  Smokeless tobacco: Not on file  ?Substance and Sexual Activity  ? Alcohol use: No  ? Drug use: No  ? Sexual activity: Not on file  ?Other Topics Concern  ? Not on file  ?Social History Narrative  ? Not on file  ? ?Social Determinants of Health  ? ?Financial Resource Strain: Not on file  ?Food Insecurity: Not on file  ?Transportation Needs: Not on file  ?Physical Activity: Not on file  ?Stress: Not on file  ?Social Connections: Not on file  ? ?History reviewed. No pertinent family history. ?Scheduled Meds: ?  stroke: early stages of recovery book   Does not apply Once  ? chlorhexidine  15 mL Mouth Rinse BID  ? Chlorhexidine Gluconate Cloth  6 each Topical Daily  ? insulin aspart  0-9 Units Subcutaneous Q4H  ? mouth rinse  15 mL Mouth  Rinse q12n4p  ? sodium chloride (PF)      ? sodium chloride (PF)      ? ?Continuous Infusions: ? ceFEPime (MAXIPIME) IV Stopped (01/14/22 0455)  ? heparin Stopped (01/14/22 1023)  ? lactated ringers 50 mL/hr at 01/14/22 1133  ? metronidazole Stopped (01/14/22 0911)  ? vancomycin    ? ?PRN Meds:.acetaminophen, HYDROmorphone (DILAUDID) injection, iohexol, LORazepam, melatonin, ondansetron (ZOFRAN) IV, oxyCODONE, polyethylene glycol, sodium chloride flush ?Medications Prior to Admission:  ?Prior to Admission medications   ?Medication Sig Start Date End Date Taking? Authorizing Provider  ?chlorproMAZINE (THORAZINE) 25 MG tablet Take 25 mg by mouth 3 (three) times daily. hiccups 01/08/22 02/07/22 Yes [provider]  ?gabapentin (NEURONTIN) 400 MG capsule Take 400 mg by mouth 2 (two) times daily as needed (hiccups). 01/08/22 02/07/22 Yes [provider]  ?guaiFENesin (ROBITUSSIN) 100 MG/5ML liquid Take 10 mLs by mouth every 4 (four) hours as needed for cough. 01/08/22  Yes [provider]  ?loperamide (IMODIUM) 2 MG capsule If having diarrhea, take 2 capsules (4 mg) after the first loose stool, then 1 capsule after each additional loose stool. 11/25/21 11/25/22 Yes [provider]  ?magic mouthwash (nystatin, diphenhydrAMINE, alum & mag hydroxide) suspension mixture Take 10 mLs by mouth every 2 (two) hours as needed for mouth pain. 01/08/22 02/07/22 Yes [provider]  ?ondansetron (ZOFRAN-ODT) 8 MG disintegrating tablet Take 8 mg by mouth every 8 (eight) hours as needed for nausea. 01/08/22 02/07/22 Yes [provider]  ?phytonadione (VITAMIN K) 5 MG tablet Take 5 mg by mouth once a week. Tuesday 01/11/22 01/11/23 Yes [provider]  ?potassium & sodium phosphates (PHOS-NAK) 280-160-250 MG PACK Take by mouth. 01/08/22  Yes [provider]  ?thiamine 100 MG tablet Take 100 mg by mouth daily. 01/09/22  Yes [provider]  ?vitamin A 3 MG (10000 UNITS) capsule Take 6  mg by mouth daily. 01/09/22 01/09/23 Yes [provider]  ?EPINEPHrine 0.3 mg/0.3 mL IJ SOAJ injection Inject 0.3 mLs (0.3 mg total) into the muscle once. ?Patient not taking: Reported on 01/26/2022 02/22/16   Lavonia Drafts, MD  ? ?Allergies  ?Allergen Reactions  ? Lisinopril Anaphylaxis  ? Amoxicillin Nausea And Vomiting  ? Aripiprazole Other (See Comments)  ?  Made patient feel "really low" per daughter at bedside  ? Olmesartan Other (See Comments)  ?  "bothered stomach"  ? Shellfish Allergy Nausea Only  ?  "Bad headache"  ? ?Review of Systems ?+weakness ? ?Physical Exam ?Generalized weakness ?Has trace edema ?Has muscle wasting ?Abdomen not distended ?Awake alert ? ?Vital Signs: BP 122/80   Pulse Marland Kitchen)  114   Temp 98.6 ?F (37 ?C) (Oral)   Resp (!) 22   Ht '5\' 9"'$  (1.753 m)   Wt 71.1 kg   SpO2 93%   BMI 23.15 kg/m?  ?Pain Scale: 0-10 ?  ?Pain Score: 0-No pain ? ? ?SpO2: SpO2: 93 % ?O2 Device:SpO2: 93 % ?O2 Flow Rate: .  ? ?IO: Intake/output summary:  ?Intake/Output Summary (Last 24 hours) at 01/14/2022 1210 ?Last data filed at 01/14/2022 1130 ?Gross per 24 hour  ?Intake 1714.01 ml  ?Output 400 ml  ?Net 1314.01 ml  ? ? ?LBM: Last BM Date :  (PTA) ?Baseline Weight: Weight: 66.2 kg ?Most recent weight: Weight: 71.1 kg     ?Palliative Assessment/Data: ? ? ?PPS 50% ? ?Time In:  46 ?Time Out:  12 ?Time Total:  60  ?Greater than 50%  of this time was spent counseling and coordinating care related to the above assessment and plan. ? ?Signed by: ?Loistine Chance, MD ?  ?Please contact Palliative Medicine Team phone at [phone] for questions and concerns.  ?For individual provider: See Amion ? ? ? ? ? ? ? ? ? ? ? ? ? ?

## 2022-01-14 NOTE — Progress Notes (Signed)
?PROGRESS NOTE ? ?Michael Kemp MAU:[id number] DOB: [birth year] DOA: 01/24/2022 ?PCP: Lorelee Market, MD ? ?Brief History   ?The patient is a 61 yr old man who presented to [facility] ED on 01/09/2022 with complaints of sudden onset of confusion. He presented with sepsis (WBC 30K, Lactic acid 3.1, tachycardia, and altered mental status). He was given IV fluids and IV antibiotics (Vancomycin, cefepime, and IV flagyl).  ? ?He was found to have bilateral lower extremity DVT's, Bilateral PE's with right heart strain. He was started on a heparin drip. CT head demonstrated Cerebellar infarct due to embolism to the PICA distribution. There is associated edema dn effacement of the ventricles.  ? ?The patient has been evaluated by neurology. He has been started on IV hypertonic saline, and he will be transferred to [facility] cone neurological ICU. ? ?Consultants  ?Neurology ?PCCM ? ?Procedures  ?None ? ?Antibiotics  ? ?Anti-infectives (From admission, onward)  ? ? Start     Dose/Rate Route Frequency Ordered Stop  ? 01/14/22 1600  vancomycin (VANCOREADY) IVPB 1500 mg/300 mL       ? 1,500 mg ?150 mL/hr over 120 Minutes Intravenous Every 24 hours 01/14/22 0023    ? 01/14/22 0800  metroNIDAZOLE (FLAGYL) IVPB 500 mg       ? 500 mg ?100 mL/hr over 60 Minutes Intravenous Every 12 hours 01/14/22 0016 01/21/22 0759  ? 01/14/22 0400  ceFEPIme (MAXIPIME) 2 g in sodium chloride 0.9 % 100 mL IVPB       ? 2 g ?200 mL/hr over 30 Minutes Intravenous Every 8 hours 01/14/22 0015 01/21/22 0359  ? 01/06/2022 1930  vancomycin (VANCOREADY) IVPB 1500 mg/300 mL       ? 1,500 mg ?150 mL/hr over 120 Minutes Intravenous  Once 01/12/2022 1918 01/11/2022 2238  ? 01/02/2022 1900  ceFEPIme (MAXIPIME) 2 g in sodium chloride 0.9 % 100 mL IVPB       ? 2 g ?200 mL/hr over 30 Minutes Intravenous  Once 01/06/2022 1850 01/11/2022 2035  ? 01/26/2022 1900  metroNIDAZOLE (FLAGYL) IVPB 500 mg       ? 500 mg ?100 mL/hr over 60 Minutes Intravenous  Once 01/12/2022 1850 01/28/2022 2138  ?  01/27/2022 1900  vancomycin (VANCOCIN) IVPB 1000 mg/200 mL premix  Status:  Discontinued       ? 1,000 mg ?200 mL/hr over 60 Minutes Intravenous  Once 01/03/2022 1850 01/12/2022 1917  ? ?  ? ?Subjective  ?The patient is resting comfortably. He is lethargic, but appropriately responsive. His daughter is at bedside. He confirms with me that he wants to continue to pursue and aggressive acute medical path and that he wishes to remain a full code.  ? ?Objective  ? ?Vitals:  ?Vitals:  ? 01/14/22 1530 01/14/22 1600  ?BP: 112/68 100/63  ?Pulse: (!) 103 (!) 102  ?Resp: (!) 24 19  ?Temp:    ?SpO2: 99% 100%  ? ? ?Exam: ? ?Constitutional:  ?Appears calm and comfortable ?Respiratory:  ?CTA bilaterally, no w/r/r.  ?Respiratory effort normal. No retractions or accessory muscle use ?Cardiovascular:  ?RRR, no m/r/g ?No LE extremity edema   ?Normal pedal pulses ?Abdomen:  ?Abdomen appears normal; no tenderness or masses ?No hernias ?No HSM ?Musculoskeletal:  ?Digits/nails BUE: no clubbing, cyanosis, petechiae, infection ?exam of joints, bones, muscles of at least one of following: head/neck, RUE, LUE, RLE, LLE   ?strength and tone normal, no atrophy, no abnormal movements ?No tenderness, masses ?Normal ROM, no contractures  ?gait and station ?  Skin:  ?No rashes, lesions, ulcers ?palpation of skin: no induration or nodules ?Neurologic:  ?CN 2-12 intact ?Sensation all 4 extremities intact ?Psychiatric:  ?Mood, affect appropriate ?Orientation to person, place, time  ?judgment and insight appear intact   ? ? ?I have personally reviewed the following:  ? ?Today's Data  ?Vitals ? ?Lab Data  ?CMP ?Lactic  ?CBC ?PTT/INR ?HbA1c ? ?Micro Data  ?Blood culture ?Urine culture ? ?Imaging  ?CTA chest ?Lower extremity doppler ?CT Brain ?MRI Brain ?CTA Head and Neck ? ?Cardiology Data  ?EKG ?Echocardiogram ? ?Other Data  ? ? ?Scheduled Meds: ? chlorhexidine  15 mL Mouth Rinse BID  ? Chlorhexidine Gluconate Cloth  6 each Topical Daily  ? feeding supplement  (PROSource TF)  90 mL Per Tube BID  ? free water  100 mL Per Tube Q3H  ? insulin aspart  0-9 Units Subcutaneous Q4H  ? mouth rinse  15 mL Mouth Rinse q12n4p  ? sodium chloride (PF)      ? ?Continuous Infusions: ? ceFEPime (MAXIPIME) IV Stopped (01/14/22 1344)  ? feeding supplement (OSMOLITE 1.5 CAL)    ? heparin 1,500 Units/hr (01/14/22 1609)  ? lactated ringers Stopped (01/14/22 1455)  ? metronidazole Stopped (01/14/22 0911)  ? sodium chloride (hypertonic) 75 mL/hr at 01/14/22 1609  ? vancomycin    ? ? ?Principal Problem: ?  Sepsis ([place]) ?Active Problems: ?  Pressure injury of skin ?  Malnutrition of moderate degree ? ? LOS: 1 day  ? ?A & P  ? ?Problem  ?Pressure Injury of Skin  ?Malnutrition of moderate degree  ?Bilateral Pulmonary Embolism ([facility])  ?Acute Bilateral Deep Vein Thrombosis (Dvt) of Femoral Veins ([facility])  ?Cerebellar Stroke ([facility])  ?Cerebral Edema ([facility])  ?Pancreatic Cancer Metastasized to Liver ([facility])  ? ? ?Acute/Subacute infarct of cerebellum with edema and lateral ventricular effacement: The patient is in danger of herniation. He has been started on hypertonic saline by neurology. He will be transferred to [facility] Neurological ICU. He may require neurosurgical evaluation and craniotomy. He is also at risk of intracranial hemorrhage due to the need for IV heparin due to bilateral pulmonary emboli. I have discussed the patient with pharmacy. He is being maintained on the low end of the heparin drip spectrum out of concern for bleed. ? ?Bilateral pulmonary emboli/Bilateral DVT/Right heart strain: The patient is receiving IV heparin at the low end of the scale due to high risk of intracranial bleed. I have discussed this with neurology and pharmacology. ? ?Hematuria: Due to heparin. The pharmacy is working to minimize heparin infusion while keeping the dose therapeutic in terms of his multiple and significant thromboses. Will monitor. May require CBI. ? 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 The patient is on palliative chemo as outpatient. The patient is followed by [facility] oncology. His lat visit appears to have been 12/23/2021. His course has been complicated by weight loss, anorexia, and hiccoughs. He is on folfirinox as palliative chemotherapy. ? ?This patient has been seen and examined myself. I have spent 48 minutes in her evaluation and plan.  ? ?DVT prophylaxis: Heparin drip ?Code Status: Full code ?Family Communication:  I have discussed the patient in detail with his daughter and sister who were at bedside. I have discussed with them his thrombosis of pulmonary arteries and lower extremity veins and the treatment. I have explained how anticoagulation  creates a serious risk of intracranial bleed in the setting of his large right cerebellar infarct. I  have also explained that he has intracranial edema and the risk of herniation. They expressed understanding and desire for continued aggressive acute medical treatment with full code status. ?Disposition Plan: tbd ? ? ?Shanay Woolman, DO ?[facility] ?Direct contact: see [email or web address]  ?7PM-7AM contact night coverage as above ?01/14/2022, 5:20 PM  LOS: 1 day  ? ? ? ? ? ?  ?

## 2022-01-14 NOTE — Progress Notes (Signed)
Bilateral lower extremity venous duplex and IVC/iliac vein duplex completed. ?Refer to "CV Proc" under chart review to view preliminary results. ? ?Preliminary results discussed with Dr. Benny Lennert. ? ?01/14/2022 9:56 AM ?Kelby Aline., MHA, RVT, RDCS, RDMS   ?

## 2022-01-14 NOTE — Consult Note (Signed)
NEUROLOGY CONSULTATION NOTE  ? ?Date of service: January 14, 2022 ?Patient Name: Michael Kemp ?MRN:  [id number] ?DOB:  09-Jun-1961 ?Reason for consult: "R cerbellar stroke" ?Requesting Provider: Karie Kirks, DO ?_ _ _   _ __   _ __ _ _  __ __   _ __   __ _ ? ?History of Present Illness  ?Michael Kemp is a 61 y.o. male with PMH significant for pancreatic adenocarcinoma with known liver metastatic disease along with concern for potential leptomeningeal involvement of brain on most recent MRI at [facility], history of hypertension who presents from home with acute onset vertigo, nausea and vomiting.  Patient reports that he was sitting on a chair when his symptoms started out of nowhere. He also endorses some shortness of breath and abdominal pain in the ED. ? ?He was admitted to the hospitalist team where work-up demonstrated a large right cerebellar stroke involving almost the entire right cerebellum along with punctate bilateral embolic appearing infarcts.  He was also found to have bilateral PEs and bilateral DVTs with right heart strain.  He was started on heparin and admitted to the ICU at [facility]. ? ?Neurology was consulted for further evaluation, work-up and management of the stroke. ? ?LKW: 1400 on 01/28/2022. ?mRS: 2 ?tNKASE: Not offered, he was outside the window at presentation. ?Thrombectomy: Not offered due to completed right cerebellar stroke with cerebral edema. ?NIHSS components Score: Comment  ?1a Level of Conscious 0'[x]'$  1'[]'$  2'[]'$  3'[]'$      ?1b LOC Questions 0'[x]'$  1'[]'$  2'[]'$       ?1c LOC Commands 0'[x]'$  1'[]'$  2'[]'$       ?2 Best Gaze 0'[]'$  1'[]'$  2'[x]'$     Right gaze preference, unable to cross midline.  ?3 Visual 0'[x]'$  1'[]'$  2'[]'$  3'[]'$      ?4 Facial Palsy 0'[x]'$  1'[]'$  2'[]'$  3'[]'$      ?5a Motor Arm - left 0'[x]'$  1'[]'$  2'[]'$  3'[]'$  4'[]'$  UN'[]'$    ?5b Motor Arm - Right 0'[x]'$  1'[]'$  2'[]'$  3'[]'$  4'[]'$  UN'[]'$    ?6a Motor Leg - Left 0'[x]'$  1'[]'$  2'[]'$  3'[]'$  4'[]'$  UN'[]'$    ?6b Motor Leg - Right 0'[x]'$  1'[]'$  2'[]'$  3'[]'$  4'[]'$  UN'[]'$    ?7 Limb Ataxia 0'[]'$  1'[]'$  2'[x]'$  3'[]'$  UN'[]'$     ?8 Sensory  0'[x]'$  1'[]'$  2'[]'$  UN'[]'$      ?9 Best Language 0'[x]'$  1'[]'$  2'[]'$  3'[]'$      ?10 Dysarthria 0'[x]'$  1'[]'$  2'[]'$  UN'[]'$      ?11 Extinct. and Inattention 0'[x]'$  1'[]'$  2'[]'$       ?TOTAL: 4   ? ? ?  ?ROS  ? ?Constitutional Endorses weight loss but no fever and chills.  ?HEENT vision is darting, no problems with hearing.  ?Respiratory + SOB  but no cough.  ?CV Denies palpitations and CP  ?GI endorses abdominal pain, nausea but no vomiting and diarrhea.  ?GU Denies dysuria and urinary frequency.  ?MSK Denies myalgia and joint pain.  ?Skin Denies rash and pruritus.  ?Neurological Denies headache and syncope.  ?Psychiatric Denies recent changes in mood. Denies anxiety and depression.  ? ?Past History  ? ?Past Medical History:  ?Diagnosis Date  ?? Hypertension   ? ?History reviewed. No pertinent surgical history. ?History reviewed. No pertinent family history. ?Social History  ? ?Socioeconomic History  ?? Marital status: Single  ?  Spouse name: Not on file  ?? Number of children: Not on file  ?? Years of education: Not on file  ?? Highest education level: Not on file  ?Occupational History  ?? Not on file  ?  Tobacco Use  ?? Smoking status: Never  ?? Smokeless tobacco: Not on file  ?Substance and Sexual Activity  ?? Alcohol use: No  ?? Drug use: No  ?? Sexual activity: Not on file  ?Other Topics Concern  ?? Not on file  ?Social History Narrative  ?? Not on file  ? ?Social Determinants of Health  ? ?Financial Resource Strain: Not on file  ?Food Insecurity: Not on file  ?Transportation Needs: Not on file  ?Physical Activity: Not on file  ?Stress: Not on file  ?Social Connections: Not on file  ? ?Allergies  ?Allergen Reactions  ?? Lisinopril Anaphylaxis  ?? Amoxicillin Nausea And Vomiting  ?? Aripiprazole Other (See Comments)  ?  Made patient feel "really low" per daughter at bedside  ?? Olmesartan Other (See Comments)  ?  "bothered stomach"  ?? Shellfish Allergy Nausea Only  ?  "Bad headache"  ? ? ?Medications  ? ?Medications Prior to Admission  ?Medication  Sig Dispense Refill Last Dose  ?? chlorproMAZINE (THORAZINE) 25 MG tablet Take 25 mg by mouth 3 (three) times daily. hiccups   01/08/2022 at am  ?? gabapentin (NEURONTIN) 400 MG capsule Take 400 mg by mouth 2 (two) times daily as needed (hiccups).   01/10/2022  ?? guaiFENesin (ROBITUSSIN) 100 MG/5ML liquid Take 10 mLs by mouth every 4 (four) hours as needed for cough.   Past Month  ?? loperamide (IMODIUM) 2 MG capsule If having diarrhea, take 2 capsules (4 mg) after the first loose stool, then 1 capsule after each additional loose stool.   01/11/2022 at am  ?? magic mouthwash (nystatin, diphenhydrAMINE, alum & mag hydroxide) suspension mixture Take 10 mLs by mouth every 2 (two) hours as needed for mouth pain.   01/07/2022  ?? ondansetron (ZOFRAN-ODT) 8 MG disintegrating tablet Take 8 mg by mouth every 8 (eight) hours as needed for nausea.   Past Week  ?? phytonadione (VITAMIN K) 5 MG tablet Take 5 mg by mouth once a week. Tuesday   01/12/2022  ?? potassium & sodium phosphates (PHOS-NAK) 280-160-250 MG PACK Take by mouth.     ?? thiamine 100 MG tablet Take 100 mg by mouth daily.   01/02/2022  ?? vitamin A 3 MG (10000 UNITS) capsule Take 6 mg by mouth daily.   01/10/2022  ?? EPINEPHrine 0.3 mg/0.3 mL IJ SOAJ injection Inject 0.3 mLs (0.3 mg total) into the muscle once. (Patient not taking: Reported on 01/19/2022) 1 Device 0 Not Taking  ?  ? ?Vitals  ? ?Vitals:  ? 01/14/22 1430 01/14/22 1500 01/14/22 1530 01/14/22 1600  ?BP: (!) 141/85 122/84 112/68 100/63  ?Pulse: (!) 116 (!) 112 (!) 103 (!) 102  ?Resp: (!) 27 (!) 24 (!) 24 19  ?Temp:      ?TempSrc:      ?SpO2: 90% 97% 99% 100%  ?Weight:      ?Height:      ?  ? ?Body mass index is 23.15 kg/m?. ? ?Physical Exam  ? ?General: Laying comfortably in bed; in no acute distress.  ?HENT: Normal oropharynx and mucosa. Normal external appearance of ears and nose.  ?Neck: Supple, no pain or tenderness  ?CV: No JVD. No peripheral edema.  ?Pulmonary: Symmetric Chest rise. Normal respiratory  effort.  ?Abdomen: Soft to touch, non-tender.  ?Ext: No cyanosis, edema, or deformity  ?Skin: No rash. Normal palpation of skin.   ?Musculoskeletal: Normal digits and nails by inspection. No clubbing.  ? ?Neurologic Examination  ?Mental status/Cognition: Alert, oriented  to self, place, month and year, good attention.  ?Speech/language: Fluent, comprehension intact, object naming intact, repetition intact. ?Cranial nerves:  ? CN II Pupils equal and reactive to light, no VF deficits  ? CN III,IV,VI R gaze deviation, unable to cross midline. Nystagmus, left beating.  ? CN V normal sensation in V1, V2, and V3 segments bilaterally  ? CN VII no asymmetry, no nasolabial fold flattening  ? CN VIII normal hearing to speech   ? CN IX & X normal palatal elevation, no uvular deviation   ? CN XI 5/5 head turn and 5/5 shoulder shrug bilaterally   ? CN XII midline tongue protrusion   ? ?Motor:  ?Muscle bulk: normal, tone normal, pronator drift none tremor none ?Mvmt Root Nerve  Muscle Right Left Comments  ?SA C5/6 Ax Deltoid     ?EF C5/6 Mc Biceps 5 5   ?EE C6/7/8 Rad Triceps 5 5   ?WF C6/7 Med FCR     ?WE C7/8 PIN ECU     ?F Ab C8/T1 U ADM/FDI 5 5   ?HF L1/2/3 Fem Illopsoas 4 4   ?KE L2/3/4 Fem Quad     ?DF L4/5 D Peron Tib Ant 5 5   ?PF S1/2 Tibial Grc/Sol 5 5   ? ?Sensation: ? Light touch Intact throughout  ? Pin prick   ? Temperature   ? Vibration   ?Proprioception   ? ?Coordination/Complex Motor:  ?- Finger to Nose with significant right upper extremity ataxia ?- Heel to shin difficulty doing but notable for ataxia more in right lower extremity compared to the left lower extremity. ?- Rapid alternating movement are slowed throughout ?- Gait: Deferred for patient's safety. ?Labs  ? ?CBC:  ?Recent Labs  ?Lab 01/15/2022 ?1739 01/14/22 ?4431  ?WBC 30.1* 21.0*  ?NEUTROABS 27.1* 17.6*  ?HGB 8.8* 7.5*  ?HCT 26.9* 22.6*  ?MCV 85.9 85.9  ?PLT 151 108*  ? ? ?Basic Metabolic Panel:  ?Lab Results  ?Component Value Date  ? NA 137 01/14/2022   ? K 4.1 01/14/2022  ? CO2 27 01/14/2022  ? GLUCOSE 107 (H) 01/14/2022  ? BUN 30 (H) 01/14/2022  ? CREATININE 0.78 01/14/2022  ? CALCIUM 7.6 (L) 01/14/2022  ? GFRNONAA >60 01/14/2022  ? ?Lipid Panel: N

## 2022-01-14 NOTE — Progress Notes (Addendum)
ANTICOAGULATION CONSULT NOTE ? ?Pharmacy Consult for Heparin ?Indication: pulmonary embolus ? ?Allergies  ?Allergen Reactions  ? Lisinopril Anaphylaxis  ? Amoxicillin Nausea And Vomiting  ? Aripiprazole Other (See Comments)  ?  Made patient feel "really low" per daughter at bedside  ? Olmesartan Other (See Comments)  ?  "bothered stomach"  ? Shellfish Allergy Nausea Only  ?  "Bad headache"  ? ? ?Patient Measurements: ?Height: '5\' 9"'$  (175.3 cm) ?Weight: 71.1 kg (156 lb 12 oz) ?IBW/kg (Calculated) : 70.7 ?Heparin Dosing Weight: TBW ? ?Vital Signs: ?Temp: 98.6 ?F (37 ?C) (04/13 1100) ?Temp Source: Oral (04/13 1100) ?BP: 122/80 (04/13 1124) ?Pulse Rate: 114 (04/13 1124) ? ?Labs: ?Recent Labs  ?  01/28/2022 ?1739 01/14/22 ?0416 01/14/22 ?2703 01/14/22 ?1039  ?HGB 8.8*  --  7.5*  --   ?HCT 26.9*  --  22.6*  --   ?PLT 151  --  108*  --   ?LABPROT  --  24.9*  --   --   ?INR  --  2.3*  --   --   ?HEPARINUNFRC  --   --   --  <0.10*  ?CREATININE 1.11  --  0.78  --   ? ? ? ?Estimated Creatinine Clearance: 98.2 mL/min (by C-G formula based on SCr of 0.78 mg/dL). ? ? ?Medical History: ?Past Medical History:  ?Diagnosis Date  ? Hypertension   ? ? ?Medications:  ?Infusions:  ? ceFEPime (MAXIPIME) IV Stopped (01/14/22 0455)  ? heparin 1,500 Units/hr (01/14/22 1215)  ? lactated ringers Stopped (01/14/22 1157)  ? metronidazole Stopped (01/14/22 0911)  ? vancomycin    ? ? ?Assessment: ?61 yo M with metastatic pancreatic cancer admitted with sepsis and acute metabolic encephalopathy.   ? ?Patient received dose of Lovenox '40mg'$  for VTE px at 0133.  Per PTA med list, he is not on anticoagulant, however does take Vit K '5mg'$  once weekly.  ? ?Baseline CBC- Hg low at 8.8, pltc at low end of normal range at 151.  These findings are consistent with labs from recent [facility] oncology visit notes.  ? ?Today, 01/14/22 ? ?-Chest CT + bilateral PE with right heart strain noted.  ? ?-Ultrasound bilateral lower extremities: ?RIGHT:  ?- Findings consistent with  acute deep vein thrombosis involving the right  femoral vein, right popliteal vein, right posterior tibial veins, right peroneal veins, and right gastrocnemius veins.  ?- No cystic structure found in the popliteal fossa.  ?   ?LEFT:  ?- Findings consistent with age indeterminate deep vein thrombosis  involving the left common femoral vein, SF junction, left femoral vein, left proximal profunda vein, left popliteal vein, left posterior tibial veins, left peroneal veins, and left gastrocnemius veins.  ?- No cystic structure found in the popliteal fossa. ? ?-MRI brain: ? 1. Acute to early subacute right PICA infarct with mild edema and partial effacement of fourth ventricle but no upstream hydrocephalus. No evidence of hemorrhagic transformation. ?2. Scattered additional smaller infarcts in the cerebellar and cerebral hemispheres, likely embolic given involvement of multiple vascular distributions. ?3. The proximal right V4 segment flow void is absent and may be occluded. Recommend CTA of the head/neck for further evaluation. ?4. The leptomeningeal disease described on the outside hospital MRI report are not well seen in the absence of intravenous contrast. ? ?-Heparin infusion off briefly pending MRI results ?-Heparin level subtherapeutic ?-Hg/plt down today - will continue to monitor ?-Given above MRI findings, discussed heparin with Dr. Benny Lennert. Continue heparin at this time given  significant clot burden, defer bolus ?-Neurology consult pending ? ? ?Goal of Therapy:  ?Heparin level 0.3-0.5 ?Monitor platelets by anticoagulation protocol: Yes ?  ?Plan:   ?-Given undetectable heparin level, increase heparin infusion to 1500 units/hr ?-Check heparin level in 6h ?-Per Neurology, do not bolus and aim for heparin level 0.3-0.5 ?-Daily heparin level & CBC while on heparin ? ?Tawnya Crook, PharmD, BCPS ?Clinical Pharmacist ?01/14/2022 12:28 PM ? ? ? ?

## 2022-01-14 NOTE — Progress Notes (Signed)
Report given to Roosevelt Locks RN and care-link. All questions answered at this time. All pt belongings were taken by patient's daughter Jonelle Sidle. Care-link will continue to carefully monitor pt.  ?

## 2022-01-14 NOTE — Progress Notes (Addendum)
This RN notified Dr Benny Lennert at 1140 of multiple infarcts found on MRI of the brain. STAT CT angio of head and neck ordered by MD and obtained. LKW ~24 hrs ago when original changes in mentation occurred, therefore no code stroke ordered and no tPA indicated. Neurology consulted. NIHSS completed by this RN and Charge RN, Josph Macho with a stroke scale of 2. Patient remains alert and oriented x4, with no new changes in mobility since admission.  ? ?MD also notified of bilateral DVT findings this AM. Heparin gtt remains infusing per orders. Transfer to [facility] ordered, awaiting bed at this time. This RN will continue to carefully monitor patient for changes in condition.  ? ?Patient's daughter remains at bedside; this RN will continue to offer support.  ?

## 2022-01-14 NOTE — Progress Notes (Signed)
Received a call from radiology, Dr. Golden Circle, regarding the patient's CTA chest critical results which revealed bilateral pulmonary embolism with right heart strain.  Heparin drip ordered to be renally dosed by pharmacy.  Follow 2D echo ordered.  Additionally, bilateral lower extremity Doppler ultrasound ordered to rule out DVT. ? ? ?We will continue to closely monitor and treat as indicated. ? ?

## 2022-01-14 NOTE — Progress Notes (Signed)
EEG complete - results pending 

## 2022-01-15 ENCOUNTER — Inpatient Hospital Stay (HOSPITAL_COMMUNITY): Payer: Medicaid Other

## 2022-01-15 DIAGNOSIS — Z515 Encounter for palliative care: Secondary | ICD-10-CM | POA: Diagnosis not present

## 2022-01-15 DIAGNOSIS — A4101 Sepsis due to Methicillin susceptible Staphylococcus aureus: Secondary | ICD-10-CM | POA: Diagnosis not present

## 2022-01-15 DIAGNOSIS — G936 Cerebral edema: Secondary | ICD-10-CM

## 2022-01-15 DIAGNOSIS — I2699 Other pulmonary embolism without acute cor pulmonale: Secondary | ICD-10-CM

## 2022-01-15 DIAGNOSIS — C259 Malignant neoplasm of pancreas, unspecified: Secondary | ICD-10-CM | POA: Diagnosis not present

## 2022-01-15 DIAGNOSIS — I82413 Acute embolism and thrombosis of femoral vein, bilateral: Secondary | ICD-10-CM

## 2022-01-15 DIAGNOSIS — R4182 Altered mental status, unspecified: Secondary | ICD-10-CM

## 2022-01-15 DIAGNOSIS — I639 Cerebral infarction, unspecified: Secondary | ICD-10-CM

## 2022-01-15 DIAGNOSIS — Z7189 Other specified counseling: Secondary | ICD-10-CM | POA: Diagnosis not present

## 2022-01-15 DIAGNOSIS — C787 Secondary malignant neoplasm of liver and intrahepatic bile duct: Secondary | ICD-10-CM

## 2022-01-15 LAB — HEPARIN LEVEL (UNFRACTIONATED)
Heparin Unfractionated: <0.1 IU/mL — ABNORMAL LOW (ref 0.30–0.70)
Heparin Unfractionated: <0.1 IU/mL — ABNORMAL LOW (ref 0.30–0.70)
Heparin Unfractionated: 0.13 IU/mL — ABNORMAL LOW (ref 0.30–0.70)

## 2022-01-15 LAB — BASIC METABOLIC PANEL
Anion gap: 7 (ref 5–15)
BUN: 32 mg/dL — ABNORMAL HIGH (ref 6–20)
CO2: 23 mmol/L (ref 22–32)
Calcium: 7.3 mg/dL — ABNORMAL LOW (ref 8.9–10.3)
Chloride: 114 mmol/L — ABNORMAL HIGH (ref 98–111)
Creatinine, Ser: 0.98 mg/dL (ref 0.61–1.24)
GFR, Estimated: >60 mL/min (ref >60)
Glucose, Bld: 129 mg/dL — ABNORMAL HIGH (ref 70–99)
Potassium: 3.6 mmol/L (ref 3.5–5.1)
Sodium: 144 mmol/L (ref 135–145)

## 2022-01-15 LAB — CBC
HCT: 22.9 % — ABNORMAL LOW (ref 39.0–52.0)
Hemoglobin: 7.4 g/dL — ABNORMAL LOW (ref 13.0–17.0)
MCH: 28.4 pg (ref 26.0–34.0)
MCHC: 32.3 g/dL (ref 30.0–36.0)
MCV: 87.7 fL (ref 80.0–100.0)
Platelets: 154 10*3/uL (ref 150–400)
RBC: 2.61 MIL/uL — ABNORMAL LOW (ref 4.22–5.81)
RDW: 16.4 % — ABNORMAL HIGH (ref 11.5–15.5)
WBC: 22.1 10*3/uL — ABNORMAL HIGH (ref 4.0–10.5)
nRBC: 0.4 % — ABNORMAL HIGH (ref 0.0–0.2)

## 2022-01-15 LAB — CBC WITH DIFFERENTIAL/PLATELET
Abs Immature Granulocytes: 0 10*3/uL (ref 0.00–0.07)
Basophils Absolute: 0 10*3/uL (ref 0.0–0.1)
Basophils Relative: 0 %
Eosinophils Absolute: 0.8 10*3/uL — ABNORMAL HIGH (ref 0.0–0.5)
Eosinophils Relative: 4 %
HCT: 22.6 % — ABNORMAL LOW (ref 39.0–52.0)
Hemoglobin: 7.3 g/dL — ABNORMAL LOW (ref 13.0–17.0)
Lymphocytes Relative: 3 %
Lymphs Abs: 0.6 10*3/uL — ABNORMAL LOW (ref 0.7–4.0)
MCH: 28.1 pg (ref 26.0–34.0)
MCHC: 32.3 g/dL (ref 30.0–36.0)
MCV: 86.9 fL (ref 80.0–100.0)
Monocytes Absolute: 0.6 10*3/uL (ref 0.1–1.0)
Monocytes Relative: 3 %
Neutro Abs: 17.7 10*3/uL — ABNORMAL HIGH (ref 1.7–7.7)
Neutrophils Relative %: 90 %
Platelets: 135 10*3/uL — ABNORMAL LOW (ref 150–400)
RBC: 2.6 MIL/uL — ABNORMAL LOW (ref 4.22–5.81)
RDW: 15.9 % — ABNORMAL HIGH (ref 11.5–15.5)
WBC: 19.7 10*3/uL — ABNORMAL HIGH (ref 4.0–10.5)
nRBC: 0.3 % — ABNORMAL HIGH (ref 0.0–0.2)
nRBC: 1 /100 WBC — ABNORMAL HIGH

## 2022-01-15 LAB — GLUCOSE, CAPILLARY
Glucose-Capillary: 140 mg/dL — ABNORMAL HIGH (ref 70–99)
Glucose-Capillary: 143 mg/dL — ABNORMAL HIGH (ref 70–99)
Glucose-Capillary: 153 mg/dL — ABNORMAL HIGH (ref 70–99)
Glucose-Capillary: 162 mg/dL — ABNORMAL HIGH (ref 70–99)
Glucose-Capillary: 165 mg/dL — ABNORMAL HIGH (ref 70–99)
Glucose-Capillary: 169 mg/dL — ABNORMAL HIGH (ref 70–99)

## 2022-01-15 LAB — URINE CULTURE: Culture: NO GROWTH

## 2022-01-15 LAB — SODIUM
Sodium: 149 mmol/L — ABNORMAL HIGH (ref 135–145)
Sodium: 151 mmol/L — ABNORMAL HIGH (ref 135–145)
Sodium: 156 mmol/L — ABNORMAL HIGH (ref 135–145)

## 2022-01-15 MED ORDER — POTASSIUM CHLORIDE 20 MEQ PO PACK
40.0000 meq | PACK | Freq: Once | ORAL | Status: AC
  Administered 2022-01-15: 40 meq

## 2022-01-15 NOTE — Evaluation (Signed)
Clinical/Bedside Swallow Evaluation ?Patient Details  ?Name: Michael Kemp ?MRN: [id number] ?Date of Birth: [birth year] ? ?Today's Date: 01/15/2022 ?Time: SLP Start Time (ACUTE ONLY): 1771 SLP Stop Time (ACUTE ONLY): 1657 ?SLP Time Calculation (min) (ACUTE ONLY): 17 min ? ?Past Medical History:  ?Past Medical History:  ?Diagnosis Date  ? Hypertension   ? ?Past Surgical History: History reviewed. No pertinent surgical history. ?HPI:  ?Pt is a 61 y.o. male who presented to the ED with sudden onset confusion. 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 Scattered additional smaller infarcts in the cerebellar and cerebral hemispheres; thought to be embolic by radiology.  EEG 4/14: moderate diffuse encephalopathy, nonspecific etiology, no seizures.   Pt also found to have bilateral PEs and bilateral DVTs with right heart strain. Pt failed [facility] swallow screen on 4/13 due to coughing. 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  ?  ?Assessment / Plan / Recommendation  ?Clinical Impression ? Pt was seen for bedside swallow evaluation with his daughter present. Both parties denied the pt having a history of dysphagia prior to admission. Oral mechanism exam was limited due to pt's difficulty following some commands; however, reduced lingual and labial ROM were noted and dentition was limited. Pt demonstrated symptoms of oropharyngeal dysphagia characterized by oral holding, multiple swallows, and signs of aspiration with puree, thin liquids, and nectar thick liquids. A modified barium swallow study is recommended to further assess swallow function and it is scheduled for today at 1300. ?SLP Visit Diagnosis: Dysphagia, unspecified (R13.10) ?   ?Aspiration Risk ? Moderate  aspiration risk  ?  ?Diet Recommendation NPO  ? ?Medication Administration: Via alternative means ?Postural Changes: Seated upright at 90 degrees  ?  ?Other  Recommendations Oral Care Recommendations: Oral care QID   ? ?Recommendations for follow up therapy are one component of a multi-disciplinary discharge planning process, led by the attending physician.  Recommendations may be updated based on patient status, additional functional criteria and insurance authorization. ? ?Follow up Recommendations  (Continued SLP services at level of care recommended by PT/OT)  ? ? ?  ?Assistance Recommended at Discharge Frequent or constant Supervision/Assistance  ?Functional Status Assessment Patient has had a recent decline in their functional status and demonstrates the ability to make significant improvements in function in a reasonable and predictable amount of time.  ?Frequency and Duration min 2x/week  ?2 weeks ?  ?   ? ?Prognosis Prognosis for Safe Diet Advancement: Fair ?Barriers to Reach Goals: Severity of deficits  ? ?  ? ?Swallow Study   ?General Date of Onset: 01/14/22 ?HPI: Pt is a 61 y.o. male who presented to the ED with sudden onset confusion. 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 Scattered additional smaller infarcts in the cerebellar and cerebral hemispheres; thought to be embolic by radiology.  EEG 4/14: moderate diffuse encephalopathy, nonspecific etiology, no seizures.   Pt also found to have bilateral PEs and bilateral DVTs with right heart strain. Pt failed [facility] swallow screen on 4/13 due to coughing. 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 ?Type of Study: Bedside Swallow Evaluation ?Previous Swallow Assessment: none ?Diet Prior to this Study:  NPO ?Temperature Spikes Noted: No ?Respiratory Status: Nasal cannula ?History of Recent Intubation: No ?Behavior/Cognition: Alert ?Oral Cavity Assessment: Dried secretions;Dry ?Oral Care Completed by SLP: Yes ?Oral Cavity - Dentition: Poor condition ?Vision: Functional for self-feeding ?Self-Feeding Abilities: Needs assist ?Patient Positioning: Upright in bed;Postural control adequate for testing ?Baseline Vocal Quality: Wet ?Volitional Cough: Weak;Congested ?Volitional Swallow: Able to elicit  ?  ?Oral/Motor/Sensory Function Overall Oral Motor/Sensory Function:  (difficult to assess)   ?Ice Chips Ice chips: Impaired ?Presentation: Spoon ?Oral Phase Impairments: Poor awareness of bolus ?Oral Phase Functional Implications: Oral holding   ?Thin Liquid Thin Liquid: Impaired ?Presentation: Cup ?Oral Phase Impairments: Reduced labial seal ?Pharyngeal  Phase Impairments: Cough - Immediate;Cough - Delayed;Wet Vocal Quality  ?  ?Nectar Thick Nectar Thick Liquid: Impaired ?Presentation: Cup ?Pharyngeal Phase Impairments: Cough - Immediate;Cough - Delayed;Wet Vocal Quality   ?Honey Thick Honey Thick Liquid: Not tested   ?Puree Puree: Impaired ?Presentation: Spoon ?Pharyngeal Phase Impairments: Multiple swallows;Cough - Immediate;Wet Vocal Quality   ?Solid ? ? ?  Solid: Not tested  ? ?  ?Morrisa Aldaba I. Hardin Negus, [place], CCC-SLP ?Acute Rehabilitation Services ?Office number [phone] ?Pager [phone] ? ?[name] ?01/15/2022,12:07 PM ? ? ? ? ? ? ?

## 2022-01-15 NOTE — Progress Notes (Addendum)
STROKE TEAM PROGRESS NOTE  ? ?INTERVAL HISTORY ?Patient is seen in his room with his daughter at the bedside.  Yesterday, he experienced acute onset vertigo, nausea and vomiting.  He presented to the ED and was found to have an acute large right cerebellar stroke.  He was also found to have acute bilateral PEs and DVTs along with sepsis from an unclear source.  He was started on IV heparin for the PEs and DVTs and was given 3% HTS to treat cerebellar edema caused by his stroke.  Patient remains awake and interactive and follows commands.  His right-sided ataxia and gaze evoked nystagmus.  Blood pressure adequately controlled.  He is on hypertonic saline 75 cc an hour and serum sodium is yet not at goal at 144 but going up. ? ?Vitals:  ? 01/15/22 0948 01/15/22 1000 01/15/22 1100 01/15/22 1200  ?BP:  121/82 117/82   ?Pulse: (!) 104 (!) 107 (!) 108   ?Resp: (!) 0 (!) 23 (!) 21   ?Temp:    98.1 ?F (36.7 ?C)  ?TempSrc:    Axillary  ?SpO2: 96% 97% 96%   ?Weight:      ?Height:      ? ?CBC:  ?Recent Labs  ?Lab 01/14/22 ?4431 01/15/22 ?0309 01/15/22 ?1147  ?WBC 21.0* 19.7* 22.1*  ?NEUTROABS 17.6* 17.7*  --   ?HGB 7.5* 7.3* 7.4*  ?HCT 22.6* 22.6* 22.9*  ?MCV 85.9 86.9 87.7  ?PLT 108* 135* 154  ? ?Basic Metabolic Panel:  ?Recent Labs  ?Lab 01/14/22 ?5400 01/14/22 ?1353 01/15/22 ?0309 01/15/22 ?8676  ?NA 138   < > 144 149*  ?K 4.1  --  3.6  --   ?CL 103  --  114*  --   ?CO2 27  --  23  --   ?GLUCOSE 107*  --  129*  --   ?BUN 30*  --  32*  --   ?CREATININE 0.78  --  0.98  --   ?CALCIUM 7.6*  --  7.3*  --   ?MG 1.9  --   --   --   ?PHOS 4.7*  --   --   --   ? < > = values in this interval not displayed.  ? ?Lipid Panel: No results for input(s): CHOL, TRIG, HDL, CHOLHDL, VLDL, LDLCALC in the last 168 hours. ?HgbA1c:  ?Recent Labs  ?Lab 01/14/22 ?0416  ?HGBA1C 5.8*  ? ?Urine Drug Screen:  ?Recent Labs  ?Lab 01/28/2022 ?2325  ?LABOPIA NONE DETECTED  ?COCAINSCRNUR NONE DETECTED  ?LABBENZ NONE DETECTED  ?AMPHETMU NONE DETECTED  ?THCU  NONE DETECTED  ?LABBARB NONE DETECTED  ?  ?Alcohol Level No results for input(s): ETH in the last 168 hours. ? ?IMAGING past 24 hours ?CT HEAD WO CONTRAST (5MM) ? ?Result Date: 01/15/2022 ?CLINICAL DATA:  Provided history: Neuro deficit, acute, stroke suspected. Assess for developing hydrocephalus. EXAM: CT HEAD WITHOUT CONTRAST TECHNIQUE: Contiguous axial images were obtained from the base of the skull through the vertex without intravenous contrast. RADIATION DOSE REDUCTION: This exam was performed according to the departmental dose-optimization program which includes automated exposure control, adjustment of the mA and/or kV according to patient size and/or use of iterative reconstruction technique. COMPARISON:  CT angiogram head/neck 01/14/2022. Brain MRI 01/14/2022. FINDINGS: Brain: No age advanced or lobar predominant parenchymal atrophy. Redemonstrated large acute/early subacute right PICA territory cerebellar infarct. There is progressive posterior fossa mass effect with progressive effacement of the fourth ventricle. However, there is no evidence of obstructive hydrocephalus  at this time. Additional known small acute/early subacute infarcts within the left cerebellar and bilateral cerebral hemispheres were better appreciated on the prior brain MRI of 01/14/2022. There is no acute intracranial hemorrhage. No extra-axial fluid collection. No evidence of an intracranial mass. No supratentorial midline shift. Vascular: No hyperdense vessel. Atherosclerotic calcifications. Skull: Normal. Negative for fracture or focal lesion. Sinuses/Orbits: Visualized orbits show no acute finding. No significant paranasal sinus disease at the imaged levels. Other: Small-volume fluid within the right mastoid air cells. IMPRESSION: 1. Redemonstrated large acute/early subacute right PICA territory cerebellar infarct. Progressive posterior fossa mass effect with progressive effacement of the fourth ventricle. However, there is no  evidence of obstructive hydrocephalus at this time. 2. Additional known small acute/early subacute infarcts within the left cerebellar and bilateral cerebral hemispheres, better appreciated on the prior brain MRI of 01/14/2022. 3. Small-volume fluid within the right mastoid air cells. Electronically Signed   By: Kellie Simmering D.O.   On: 01/15/2022 09:40  ? ?EEG adult ? ?Result Date: 01/15/2022 ?Lora Havens, MD     01/15/2022  8:17 AM Patient Name: Michael Kemp MRN: [id number] Epilepsy Attending: Lora Havens Referring Physician/Provider: Kayleen Memos, DO Date: 01/14/2022 Duration: 23.05 mins Patient history: 61 y.o. male who is admitted with a large right cerebellar stroke involving almost the entire right cerebellum along with punctate bilateral embolic appearing infarcts.  He also has bilateral PEs and bilateral DVTs with right heart strain. EEG to evaluate for seizure Level of alertness:  lethargic AEDs during EEG study: None Technical aspects: This EEG study was done with scalp electrodes positioned according to the 10-20 International system of electrode placement. Electrical activity was acquired at a sampling rate of '500Hz'$  and reviewed with a high frequency filter of '70Hz'$  and a low frequency filter of '1Hz'$ . EEG data were recorded continuously and digitally stored. Description: EEG showed continuous generalized predominantly 5 to 6 Hz theta slowing admixed with intermittent generalized 2 to 3 Hz delta slowing.  Hyperventilation and photic stimulation were not performed.   ABNORMALITY - Continuous slow, generalized IMPRESSION: This study is suggestive of moderate diffuse encephalopathy, nonspecific etiology. No seizures or epileptiform discharges were seen throughout the recording. Michael Kemp   ? ?PHYSICAL EXAM ?General:  Thin-cachectic appearing appearing, African-American male t in no acute distress. ?Respiratory:  Regular, unlabored respirations on room air ? ?NEURO:  ?Mental Status: Drowsy  but easily arousable.  Oriented to time place and person. ?Speech/Language: speech is with some dysarthria, no aphasia.  Naming, repetition, fluency, and comprehension intact. ? ?Cranial Nerves:  ?II: PERRL. Visual fields full.  Gaze evoked nystagmus present, greatest when looking to the left ?III, IV, VI: EOMI. Eyelids elevate symmetrically.  ?V: Sensation is intact to light touch and symmetrical to face.  ?VII: Smile is symmetrical.  ?VIII: hearing intact to voice. ?IX, X:  Phonation is normal.  ?XII: tongue is midline without fasciculations. ?Motor: 5/5 strength to all muscle groups tested.  ?Tone: is normal and bulk is normal ?Sensation- Intact to light touch bilaterally.   ?Coordination: FTN with ataxia worse on right, HKS: ataxia worse on right.No drift.  ?Gait- deferred ? ? ? ?ASSESSMENT/PLAN ?Mr. MIACHEL NARDELLI is a 61 y.o. male with history of pancreatic cancer with metastases to the liver and brain and HTN presenting with acute onset vertigo, nausea and vomiting.  He presented to the ED and was found to have an acute large right cerebellar stroke.  He was also found to have  acute bilateral PEs and DVTs along with sepsis from an unclear source.  He was started on IV heparin for the PEs and DVTs and was given 3% HTS to treat cerebellar edema caused by his stroke. ? ?Stroke:  right posterior inferior cerebellar artery infarct with cytotoxic edema and mild mass effect on the fourth ventricle likely secondary to hypercoagulability from metastatic pancreatic cancer.  Patient also has concurrent DVT and pulmonary embolism ?CT head evolving acute right PICA territory infarct in right cerebellar hemisphere with posterior fossa mass effect but no hydrocephalus ?CTA head & neck occlusion of right vertebral artery at C2 level, occluded right PICA, enlargement of right lobe of thyroid gland ?CT perfusion 48m region of hypoperfused parenchyma in right cerebellar hemisphere ?MRI  acute to early subacute right PICA  infarct with mild edema and partial effacement of fourth ventricle, no hydrocephalus, scattered additional infarcts in bilateral cerebral and cerebellar hemispheres ?2D Echo EF 60-65%moderate LVH, normal atrial

## 2022-01-15 NOTE — Progress Notes (Signed)
ANTICOAGULATION CONSULT NOTE ? ?Pharmacy Consult for Heparin ?Indication: pulmonary embolus ? ?Allergies  ?Allergen Reactions  ? Lisinopril Anaphylaxis  ? Amoxicillin Nausea And Vomiting  ? Aripiprazole Other (See Comments)  ?  Made patient feel "really low" per daughter at bedside  ? Olmesartan Other (See Comments)  ?  "bothered stomach"  ? Shellfish Allergy Nausea Only  ?  "Bad headache"  ? ? ?Patient Measurements: ?Height: '5\' 9"'$  (175.3 cm) ?Weight: 71.1 kg (156 lb 12 oz) ?IBW/kg (Calculated) : 70.7 ?Heparin Dosing Weight: TBW ? ?Vital Signs: ?Temp: 98 ?F (36.7 ?C) (04/14 0000) ?Temp Source: Oral (04/14 0000) ?BP: 102/75 (04/14 0300) ?Pulse Rate: 97 (04/14 0300) ? ?Labs: ?Recent Labs  ?  01/17/2022 ?1739 01/14/22 ?0416 01/14/22 ?4081 01/14/22 ?1039 01/14/22 ?1820 01/15/22 ?0309  ?HGB 8.8*  --  7.5*  --   --  7.3*  ?HCT 26.9*  --  22.6*  --   --  22.6*  ?PLT 151  --  108*  --   --  135*  ?LABPROT  --  24.9*  --   --   --   --   ?INR  --  2.3*  --   --   --   --   ?HEPARINUNFRC  --   --   --  <0.10* <0.10* <0.10*  ?CREATININE 1.11  --  0.78  --   --  0.98  ? ? ?Estimated Creatinine Clearance: 80.2 mL/min (by C-G formula based on SCr of 0.98 mg/dL). ? ?Assessment: ?61 yo M with metastatic pancreatic cancer admitted with sepsis and acute metabolic encephalopathy.   ?Pt on heparin for bilateral PE with right heart strain and acute bilateral DVTs. Pt also with large right cerebellar stroke. ? ?Dr. Lorrin Goodell and Dr. Benny Lennert in agreement with heparin due to large clot burden even with new large stroke. Request no bolus and low goal. ? ?Heparin level undetectable: <0.10  @ 1750 units/hr; No issues with infusion; RN reports hematuria since being on the unit, Hgb and Plt low but stable, will continue to monitor closely and order CBC with confirmatory lab. ? ?Goal of Therapy:  ?Heparin level 0.3-0.5 units/hr ?Monitor platelets by anticoagulation protocol: Yes ?  ?Plan:   ?Increase heparin to 1950 units/hr ?Check heparin level  in 6-8 hr ? ?Georga Bora, PharmD ?Clinical Pharmacist ?01/15/2022 3:58 AM ?Please check AMION for all [place] numbers ? ? ? ?

## 2022-01-15 NOTE — Progress Notes (Signed)
Modified Barium Swallow Progress Note ? ?Patient Details  ?Name: Michael Kemp ?MRN: [id number] ?Date of Birth: Jan 16, 1961 ? ?Today's Date: 01/15/2022 ? ?Modified Barium Swallow completed.  Full report located under Chart Review in the Imaging Section. ? ?Brief recommendations include the following: ? ?Clinical Impression ? Pt presents with oropharyngeal dysphagia characterized by impaired posterior bolus propulsion, reduced bolus cohesion, a pharyngeal delay, reduced posterior tongue base retraction, reduced hyolaryngeal elevation, reduced anterior laryngeal movement, and reduced pharyngeal constriction. He demonstrated premature spillage to the valleculae with spillover to the pyriform sinuses, vallecular residue, pyriform sinus residue, posterior pharyngeal wall residue, and incomplete epiglottic inversion which resulted in reduced airway protection. The swallow was often triggered with >50% of bolus at the level of the pyriform sinuses. Pt demonstrated aspiration (PAS 7,8) before, during and after deglutition of thin liquids, nectar thick liquids, and purees. Aspiration episodes most frequently triggered coughing, but aspiration was of at least ~50% of boluses and pt's cough was too weak to expell aspirated material. Pt was unable to implement many compensatory strategies, but no functional benefit was noted with a chin tuck, a head turn or with reduced bolus sizes. Oropharyngeal suctioning (by SLP) and nasopharyngeal suctioning (by RN) were performed, but did not eliminate much of the significant pharyngeal residue left at the end of the study. Pt was transported upright in bed and cueing provided by RN to continue swallowing intermittently. Pt is at high risk for aspiration before, during, and after deglutition. It is recommended that the pt's NPO status be maintained, but that limited (~2-3/hour) ice chips be allowed after thorough oral care. Should family's GOC change to that of comfort and they wish to  pursue of p.o. diet with known aspiration risk, pay may be less symptomatic with 1/2 tsp boluses of puree and nectar thick liquids boluses via tsp. SLP will foll for dysphagia treatment. ?  ?Swallow Evaluation Recommendations ? ?   ? ? SLP Diet Recommendations: NPO;Ice chips PRN after oral care (limited (i.e., 2-3 ice chips/hr after thorough oral care)) ? ?   ? ? Medication Administration: Via alternative means ? ?   ? ?   ? ?   ? ? Oral Care Recommendations: Oral care QID ? ?   ? ?Nahshon Reich I. Hardin Negus, [place], CCC-SLP ?Acute Rehabilitation Services ?Office number [phone] ?Pager [phone] ? ? ?[name] ?01/15/2022,2:45 PM ?

## 2022-01-15 NOTE — Progress Notes (Addendum)
? ?NAME:  Michael Kemp, MRN:  [id number], DOB:  03-22-1961, LOS: 2 ?ADMISSION DATE:  01/25/2022, CONSULTATION DATE:  01/14/2022 ?REFERRING MD:  Dr. Benny Lennert, CHIEF COMPLAINT:  Code stroke   ? ?History of Present Illness:  ?61 y/o M with a PMH significant for pancreatic adenocarcinoma with mets to the liver and recent leptomeningeal spead on MRI brain/spine at [facility], diabetes, HTN, GERD, and failure to thrive who presented to the [facility] ED for sudden onset confusion.  ? ?On ED arrival patient was seen with stable vital signs but new underlying confusion persisted. Labwork significant for leukocytosis, anemia, AKI, and elevated liver enzymes.  CT angio chest/abdomen/pelvis obtained on arrival and revealed bilateral pulmonary emboli with CT evidence of right heart strain as well as known distal pancreatic tail mass with mild ascites and splenic venous occlusion of chronic nature.  Head CT on admit negative ? 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 ?Given multiple medical conditions with new acute strokes neurology and critical care was consulted to assist in further care. Patient will be transferred to [facility] for further neurologic care.  ? ?Pertinent  Medical History  ?Pancreatic adenocarcinoma with mets to the liver and recent leptomeningeal spear on MRI brain/spine at [facility], diabetes, HTN, GERD, and failure to thrive ? ?Significant Hospital Events: ?Including procedures, antibiotic start and stop dates in addition to other pertinent events   ?4/12 admitted with new onset confusion found to have bilateral PE with CT evidence of right heart strain  ?4/13 MRI brain was completed and revealed acute to early subacute right PICA infarct with mild edema and partial effacement of fourth  ventricle, scattered additional smaller infarcts, potentially occluded proximal right V4 segment flow, and known leptomeningeal disease. PCCM consulted ? ?Interim History / Subjective:  ?Afebrile, WBC 19.7 ?I/O 1L UOP, 2.4+ in last 24 hours ?Glucose trend 129-169 ?Pt reports feeling hungry ? ?Objective   ?Blood pressure 117/82, pulse (!) 108, temperature 98.5 ?F (36.9 ?C), temperature source Axillary, resp. rate (!) 21, height '5\' 9"'$  (1.753 m), weight 71.1 kg, SpO2 96 %. ?   ?   ? ?Intake/Output Summary (Last 24 hours) at 01/15/2022 1116 ?Last data filed at 01/15/2022 1100 ?Gross per 24 hour  ?Intake 3706.8 ml  ?Output 1050 ml  ?Net 2656.8 ml  ? ?Filed Weights  ? 01/08/2022 1958 01/14/22 0100  ?Weight: 66.2 kg 71.1 kg  ? ? ?Examination: ?General: cachectic adult male lying in bed in NAD   ?HEENT: MM pink/dry, good dentition, anicteric, temporal wasting  ?Neuro: awake, alert, speech clear, MAE ?CV: s1s2 RRR, no m/r/g ?PULM: non-labored at rest, lungs bilaterally clear  ?GI: soft, bsx4 active  ?Extremities: warm/dry, trace edema  ?Skin: no rashes or lesions ? ?Resolved Hospital Problem list   ? ? ?Assessment & Plan:  ? ?Severe Sepsis (POA) ?Possible intraabdominal process vs pneumonia  ?-follow cultures to maturity  ?-continue broad spectrum antibiotics  ?-MAP goal >65  ?-follow for evidence of end organ dysfunction  ? ?Acute bilateral PE with CT evidence of right heart strain  ?Bilateral acute lower extremity DVT  ?-continue heparin gtt per pharmacy  ?-high risk for intracranial bleeding given new strokes  ?-follow neuro exam  ?-pending clinical course, might need IVC filter ? ?Acute to subacute PICA infarct with multiple scattered other infarcts  ?  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 ?Stage IV Metastatic pancreatic cancer with mets to the liver and brain  ?Elevated liver enzymes  ?Hypoalbuminemia  ?Followed at [facility], was receiving palliative Folfirinox at [facility]  ?-supportive care  ?-avoid hepatotoxins  ? ?Severe protein calorie malnutrition  ?Failure to thrive  ?At Risk Aspiration  ?-PT/OT ?-push nutrition as able  ?-pending MBS ?-aspiration precautions  ? ?Stage II sacral pressure injury  ?-wound care as appropriate ?-pressure alleviating measures  ? ?Elevated INR  ?Anemia ?Thrombocytopaenia  ?-high risk ICH / hemorrhagic conversion given thrombocytopenia & need for anticoagulation for acute DVT/PE  ?-monitor closely while on heparin  ? ?Type 2 diabetes  ?A1C 5.3 ?-SSI ?-Glucose goal 140-180  ? ?Best Practice (right click and "Reselect all SmartList Selections" daily)  ?Diet/type: NPO ?DVT prophylaxis: systemic heparin ?GI prophylaxis: PPI ?Lines: N/A ?Foley:  N/A ?Code Status:  full code ?Last date of multidisciplinary goals of care discussion: Per neurology and palliative care family wishes to proceed with full scope of care and to remain full code.  Last meeting 4/14 with Dr. Rowe Pavy.  ? ?Critical care time:  36 minutes   ? ?Noe Gens, MSN, APRN, NP-C, AGACNP-BC ?[facility] ?01/15/2022, 11:16 AM ? ? ?Please see [email or web address] for pager details.  ? ?From 7A-7P if no response, please call [phone] ?After hours, please call Warren Lacy [phone] ? ? ? ? ? ? ? ?

## 2022-01-15 NOTE — Progress Notes (Signed)
ANTICOAGULATION CONSULT NOTE ? ?Pharmacy Consult for Heparin ?Indication: pulmonary embolus ? ?Allergies  ?Allergen Reactions  ? Lisinopril Anaphylaxis  ? Amoxicillin Nausea And Vomiting  ? Aripiprazole Other (See Comments)  ?  Made patient feel "really low" per daughter at bedside  ? Olmesartan Other (See Comments)  ?  "bothered stomach"  ? Shellfish Allergy Nausea Only  ?  "Bad headache"  ? ? ?Patient Measurements: ?Height: '5\' 9"'$  (175.3 cm) ?Weight: 71.1 kg (156 lb 12 oz) ?IBW/kg (Calculated) : 70.7 ?Heparin Dosing Weight: TBW ? ?Vital Signs: ?Temp: 97.9 ?F (36.6 ?C) (04/14 2040) ?Temp Source: Axillary (04/14 2040) ?BP: 107/75 (04/14 2100) ?Pulse Rate: 114 (04/14 2100) ? ?Labs: ?Recent Labs  ?  01/03/2022 ?1739 01/14/22 ?0416 01/14/22 ?5038 01/14/22 ?1039 01/15/22 ?0309 01/15/22 ?1147 01/15/22 ?2126  ?HGB 8.8*  --  7.5*  --  7.3* 7.4*  --   ?HCT 26.9*  --  22.6*  --  22.6* 22.9*  --   ?PLT 151  --  108*  --  135* 154  --   ?LABPROT  --  24.9*  --   --   --   --   --   ?INR  --  2.3*  --   --   --   --   --   ?HEPARINUNFRC  --   --   --    < > <0.10* <0.10* 0.13*  ?CREATININE 1.11  --  0.78  --  0.98  --   --   ? < > = values in this interval not displayed.  ? ? ?Estimated Creatinine Clearance: 80.2 mL/min (by C-G formula based on SCr of 0.98 mg/dL). ? ?Assessment: ?61 yo M with metastatic pancreatic cancer admitted with sepsis and acute metabolic encephalopathy. Pt on heparin for bilateral PE with right heart strain and acute bilateral DVTs. Pt also with large right cerebellar stroke. ? ?Neurology and primary team in agreement with heparin due to large clot burden even with new large stroke. Request no bolus and low goal. ? ?Heparin level finally detectable but still subtherapeutic (0.13) on infusion at 2100 units/hr. No issues with line or bleeding reported per RN. ? ?Goal of Therapy:  ?Heparin level 0.3-0.5 units/hr ?Monitor platelets by anticoagulation protocol: Yes ?  ?Plan:   ?No bolus per protocol. Increase  heparin to 2250 units/hr ?Check heparin level in 6hrs ? ?Sherlon Handing, PharmD, BCPS ?Please see amion for complete clinical pharmacist phone list ?01/15/2022 10:02 PM ?

## 2022-01-15 NOTE — Progress Notes (Signed)
? ?                                                                                                                                                     ?                                                   ?Daily Progress Note  ? ?Patient Name: Michael Kemp       Date: 01/15/2022 ?DOB: 07/08/1961  Age: 61 y.o. MRN#: [id number] ?Attending Physician: Jacky Kindle, MD ?Primary Care Physician: Lorelee Market, MD ?Admit Date: 01/27/2022 ? ?Reason for Consultation/Follow-up: Establishing goals of care ? ?Subjective: ? Patient has just returned from CT head, daughter at bedside, re discussed code status and broad goals of care, see below.  ? ?Length of Stay: 2 ? ?Current Medications: ?Scheduled Meds:  ? chlorhexidine  15 mL Mouth Rinse BID  ? Chlorhexidine Gluconate Cloth  6 each Topical Daily  ? chlorproMAZINE  25 mg Per Tube TID  ? feeding supplement (PROSource TF)  90 mL Per Tube BID  ? insulin aspart  0-9 Units Subcutaneous Q4H  ? mouth rinse  15 mL Mouth Rinse q12n4p  ? potassium & sodium phosphates  1 packet Per Tube TID AC & HS  ? thiamine  100 mg Per Tube Daily  ? vitamin A  20,000 Units Per Tube Daily  ? ? ?Continuous Infusions: ? ceFEPime (MAXIPIME) IV Stopped (01/15/22 0415)  ? feeding supplement (OSMOLITE 1.5 CAL) 1,000 mL (01/14/22 1948)  ? heparin 1,950 Units/hr (01/15/22 0806)  ? lactated ringers Stopped (01/14/22 1455)  ? metronidazole 500 mg (01/15/22 0815)  ? sodium chloride (hypertonic) 75 mL/hr at 01/15/22 0600  ? vancomycin Stopped (01/14/22 1940)  ? ? ?PRN Meds: ?acetaminophen, gabapentin, HYDROmorphone (DILAUDID) injection, iohexol, LORazepam, melatonin, ondansetron (ZOFRAN) IV, oxyCODONE, polyethylene glycol, sodium chloride flush ? ?Physical Exam         ?Resting in bed ?Appears weak and chronically ill ?No distress ?Verbalizes slowly, answers questions asked ?Regular work of breathing ?Monitor noted ?No edema ? ?Vital Signs: BP 114/76   Pulse (!) 110   Temp 98.5 ?F (36.9 ?C) (Axillary)    Resp (!) 27   Ht '5\' 9"'$  (1.753 m)   Wt 71.1 kg   SpO2 95%   BMI 23.15 kg/m?  ?SpO2: SpO2: 95 % ?O2 Device: O2 Device: Nasal Cannula ?O2 Flow Rate: O2 Flow Rate (L/min): 6 L/min ? ?Intake/output summary:  ?Intake/Output Summary (Last 24 hours) at 01/15/2022 1020 ?Last data filed at 01/15/2022 0600 ?Gross per 24 hour  ?Intake 3138.37 ml  ?Output 1050 ml  ?Net 2088.37 ml  ? ?LBM: Last BM Date :  (  pta) ?Baseline Weight: Weight: 66.2 kg ?Most recent weight: Weight: 71.1 kg ? ?     ?Palliative Assessment/Data: ? ? ? ? ? ?Patient Active Problem List  ? Diagnosis Date Noted  ? Pressure injury of skin 01/14/2022  ? Malnutrition of moderate degree 01/14/2022  ? Bilateral pulmonary embolism ([place]) 01/14/2022  ? Acute bilateral deep vein thrombosis (DVT) of femoral veins ([place]) 01/14/2022  ? Cerebellar stroke ([place]) 01/14/2022  ? Cerebral edema ([place]) 01/14/2022  ? Pancreatic cancer metastasized to liver ([place]) 01/14/2022  ? Sepsis ([place]) 01/19/2022  ? ? ?Palliative Care Assessment & Plan  ? ?Patient Profile: ?  ? ?Assessment: ? pancreatic adenocarcinoma with known liver metastatic disease along with concern for potential leptomeningeal involvement of brain on most recent MRI at [facility], history of hypertension who presents from home with acute onset vertigo, nausea and vomiting ?acute bilateral pulmonary emboli  ?Transferred from [place] to [facility] when  large right cerebellar stroke involving almost the entire right cerebellum along with punctate bilateral embolic appearing infarcts were seen on MRI ?PPS 50% ? ?Recommendations/Plan: ?Code status and goals of care discussions again undertaken with daughter at bedside, patient defers to daughter to make decisions for him. She endorses continuation of full code and full scope of care. She states, "we are going to exhaust all resources. 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 Offered supportive presence and compassionate listening and acknowledged her hopes for the patient's recovery.  PMT will not follow regularly, will monitor chart for hospital course and overall disease trajectory of illness.  ? ?Goals of Care and Additional Recommendations: ?Limitations on Scope of Treatment: Full Scope Treatment ? ?Code Status: ? ?  ?Code Status Orders  ?(From admission, onward)  ?  ? ? ?  ? ?  Start     Ordered  ? 01/16/2022 2137  Full code  Continuous       ? 01/04/2022 2137  ? ?  ?  ? ?  ? ?Code Status History   ? ? This patient has a current code status but no historical code status.  ? ?  ? ? ?Prognosis: ? Unable to determine ? ?Discharge Planning: ?To Be Determined ? ?Care plan was discussed with patient, daughter at bedside.  ? ?Thank you for allowing the Palliative Medicine Team to assist in the care of this patient. ? ? ?Time In: 9 Time Out: 9.25 Total Time 25 Prolonged Time Billed  no   ? ?   ?Greater than 50%  of this time was spent counseling and coordinating care related to the above assessment and plan. ? ?Loistine Chance, MD ? ?Please contact Palliative Medicine Team phone at [phone] for questions and concerns.  ? ? ? ? ? ?

## 2022-01-15 NOTE — Procedures (Signed)
Patient Name: Michael Kemp  ?MRN: [id number]  ?Epilepsy Attending: Lora Havens  ?Referring Physician/Provider: Kayleen Memos, DO ?Date: 01/14/2022 ?Duration: 23.05 mins ? ?Patient history: 61 y.o. male who is admitted with a large right cerebellar stroke involving almost the entire right cerebellum along with punctate bilateral embolic appearing infarcts.  He also has bilateral PEs and bilateral DVTs with right heart strain. EEG to evaluate for seizure ? ?Level of alertness:  lethargic  ? ?AEDs during EEG study: None ? ?Technical aspects: This EEG study was done with scalp electrodes positioned according to the 10-20 International system of electrode placement. Electrical activity was acquired at a sampling rate of '500Hz'$  and reviewed with a high frequency filter of '70Hz'$  and a low frequency filter of '1Hz'$ . EEG data were recorded continuously and digitally stored.  ? ?Description: EEG showed continuous generalized predominantly 5 to 6 Hz theta slowing admixed with intermittent generalized 2 to 3 Hz delta slowing.  Hyperventilation and photic stimulation were not performed.    ? ?ABNORMALITY ?- Continuous slow, generalized ? ?IMPRESSION: ?This study is suggestive of moderate diffuse encephalopathy, nonspecific etiology. No seizures or epileptiform discharges were seen throughout the recording. ? ?Lora Havens  ? ?

## 2022-01-15 NOTE — Evaluation (Signed)
Speech Language Pathology Evaluation ?Patient Details ?Name: Michael Kemp ?MRN: [id number] ?DOB: 21-Feb-1961 ?Today's Date: 01/15/2022 ?Time: 2542-7062 ?SLP Time Calculation (min) (ACUTE ONLY): 26 min ? ?Problem List:  ?Patient Active Problem List  ? Diagnosis Date Noted  ? Pressure injury of skin 01/14/2022  ? Malnutrition of moderate degree 01/14/2022  ? Bilateral pulmonary embolism ([place]) 01/14/2022  ? Acute bilateral deep vein thrombosis (DVT) of femoral veins ([place]) 01/14/2022  ? Cerebellar stroke ([place]) 01/14/2022  ? Cerebral edema ([place]) 01/14/2022  ? Pancreatic cancer metastasized to liver ([place]) 01/14/2022  ? Sepsis ([place]) 01/28/2022  ? ?Past Medical History:  ?Past Medical History:  ?Diagnosis Date  ? Hypertension   ? ?Past Surgical History: History reviewed. No pertinent surgical history. ?HPI:  ?Pt is a 61 y.o. male who presented to the ED with sudden onset confusion. 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 Scattered additional smaller infarcts in the cerebellar and cerebral hemispheres; thought to be embolic by radiology.  EEG 4/14: moderate diffuse encephalopathy, nonspecific etiology, no seizures.   Pt also found to have bilateral PEs and bilateral DVTs with right heart strain. Pt failed [facility] swallow screen on 4/13 due to coughing. PMT 4/14: pt's daughter stated, "we are going to exhaust all resources. My answer is not going to change no matter how many times this is asked, I want everything done." PMH: pancreatic adenocarcinoma with mets to liver and recent findings of leptomeningeal spread on MRI brain/spine at [facility](12/27/21), DM2, failure to thrive in an adult, HTN, GERD, Conversion of G-tube to G-J tube 4/3 at [facility].  ? ?Assessment / Plan / Recommendation ?Clinical Impression ? Pt participated in speech-language-cognition evaluation with his daughter present. Both parties reported that the pt had been independent with medication  and financial management prior to admission. 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 Both parties initially denied any baseline or acute changes in cognition. However, at the end of the evaluation, pt described his cognition as "different" and his daughter stated that his processing/thinking were slower and more impaired compared to baseline. The [facility] Mental Status Examination was completed to evaluate the pt's cognitive-linguistic skills. He achieved a score of 3/30 which is significantly below the normal limits of 27 or more out of 30. He exhibited deficits in the areas of temporal orientation, awareness, attention, memory, problem solving, and executive function. Moderate dysarthria was noted characterized by reduced vocal intensity, articulatory imprecision, and a impaired ability adequately coordinate respiration with speech. Skilled SLP services are clinically indicated at this time to improve motor speech and cognitive-linguistic function. ?   ?SLP Assessment ? SLP Recommendation/Assessment: Patient needs continued [place] [facility] Services ?SLP Visit Diagnosis: Cognitive communication deficit (R41.841)  ?  ?Recommendations for follow up therapy are one component of a multi-disciplinary discharge planning process, led by the attending physician.  Recommendations may be updated based on patient status, additional functional criteria and insurance authorization. ?   ?Follow Up Recommendations ?  (Continued SLP services at level of care recommended by PT/OT)  ?  ?Assistance Recommended at Discharge ? Frequent or constant Supervision/Assistance  ?Functional Status Assessment Patient has had a recent decline in their functional status and demonstrates the ability to make significant improvements in function in a reasonable and predictable amount of time.  ?Frequency and Duration min  2x/week  ?2  weeks ?  ?   ?SLP Evaluation ?Cognition ? Overall Cognitive Status: Impaired/Different from baseline ?Arousal/Alertness: Lethargic ?Orientation Level: Oriented to person;Oriented to situation;Disoriented to time ?Year:  (2003) ?Month: April ?Day of Week: Incorrect ?Attention: Focused;Sustained ?Focused Attention: Impaired ?Focused Attention Impairment: Verbal complex ?Sustained Attention: Impaired ?Sustained Attention Impairment: Verbal complex ?Memory: Impaired ?Memory Impairment: Decreased recall of new information;Storage deficit;Decreased short term memory (Immediate: 5/5 with repetition x2; delayed: 0/5 with cues: 0/5) ?Awareness: Impaired ?Awareness Impairment: Emergent impairment ?Problem Solving: Impaired ?Problem Solving Impairment: Verbal complex (Money: 0/3 despite repetition x5 and pt's attempts) ?Executive Function: Sequencing;Organizing ?Sequencing: Impaired ?Sequencing Impairment: Verbal complex (clock drawing: 04; the impact of visual impairments on this is questioned) ?Organizing: Impaired ?Organizing Impairment: Verbal complex (backward digit span: 0/3)  ?  ?   ?Comprehension ? Auditory Comprehension ?Overall Auditory Comprehension: Impaired ?Yes/No Questions: Within Functional Limits ?Commands: Impaired ?One Step Basic Commands: 75-100% accurate  ?  ?Expression Expression ?Primary Mode of Expression: Verbal ?Verbal Expression ?Overall Verbal Expression: Appears within functional limits for tasks assessed ?Initiation: No impairment ?Level of Generative/Spontaneous Verbalization: Conversation ?Repetition: No impairment ?Naming: No impairment   ?Oral / Motor ? Oral Motor/Sensory Function ?Overall Oral Motor/Sensory Function:  (difficult to assess) ?Motor Speech ?Overall Motor Speech: Impaired ?Respiration: Impaired ?Level of Impairment: Conversation ?Phonation: Low vocal intensity;Wet ?Resonance: Within functional limits ?Articulation: Impaired ?Level of Impairment:  Conversation ?Intelligibility: Intelligibility reduced ?Word: 75-100% accurate ?Phrase: 75-100% accurate ?Sentence: 50-74% accurate ?Conversation: 50-74% accurate ?Motor Planning: Witnin functional limits ?Motor Speech Errors: Aware;Consistent   ?        ?Michael Kemp I. Hardin Negus, [place], CCC-SLP ?Acute Rehabilitation Services ?Office number [phone] ?Pager [phone] ? ?[name] ?01/15/2022, 12:24 PM ? ? ? ? ?

## 2022-01-15 NOTE — Evaluation (Signed)
Physical Therapy Evaluation ?Patient Details ?Name: Michael Kemp ?MRN: [id number] ?DOB: May 28, 1961 ?Today's Date: 01/15/2022 ? ?History of Present Illness ? 61 y.o. male presents to [facility] hospital on 01/14/2022 as a transfer from [place]. Pt with sudden onset confusion, found to be septic with BLE DVT and bilateral PE with R heart strain. 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 PMH includes HTN and pancreatic adenocarcinoma with known liver mets along with concern for potential leptomeningeal involvement of brain.  ?Clinical Impression ? Pt presents to PT with deficits in functional mobility, gait, balance, power, strength, endurance, cognition. Pt with significant generalized weakness, needing physical assistance to perform all functional mobility at this time. Pt with tendency for R lateral lean but can correct with verbal and visual cues. Pt fatigues with sitting this session, and will benefit from progression to out of bed mobility as soon as he is able to tolerate. Pt's family express the goal of pursuing post-acute rehab in an effort to return to the patient's baseline of ambulating and performing ADLs without assistance. PT recommends AIR admission at this time. Pt will need to demonstrate improvement in activity tolerance to be an ideal candidate for this setting.   ?   ? ?Recommendations for follow up therapy are one component of a multi-disciplinary discharge planning process, led by the attending physician.  Recommendations may be updated based on patient status, additional functional criteria and insurance authorization. ? ?Follow Up Recommendations Acute inpatient rehab (3hours/day) ? ?  ?Assistance Recommended at Discharge Frequent or constant Supervision/Assistance  ?Patient can return home with the following ? Two people to help with walking and/or transfers;Two people to help with bathing/dressing/bathroom;Assistance  with cooking/housework;Direct supervision/assist for medications management;Direct supervision/assist for financial management;Assist for transportation;Help with stairs or ramp for entrance ? ?  ?Equipment Recommendations None recommended by PT  ?Recommendations for Other Services ? Rehab consult  ?  ?Functional Status Assessment Patient has had a recent decline in their functional status and demonstrates the ability to make significant improvements in function in a reasonable and predictable amount of time.  ? ?  ?Precautions / Restrictions Precautions ?Precautions: Fall ?Precaution Comments: spontaneous nystagmus ?Restrictions ?Weight Bearing Restrictions: No  ? ?  ? ?Mobility ? Bed Mobility ?Overal bed mobility: Needs Assistance ?Bed Mobility: Supine to Sit, Sit to Sidelying ?  ?  ?Supine to sit: Max assist, +2 for physical assistance ?  ?Sit to sidelying: +2 for physical assistance, Max assist ?  ?  ? ?Transfers ?Overall transfer level:  (fatigue limiting) ?  ?  ?  ?  ?  ?  ?  ?  ?  ?  ? ?Ambulation/Gait ?  ?  ?  ?  ?  ?  ?  ?  ? ?Stairs ?  ?  ?  ?  ?  ? ?Wheelchair Mobility ?  ? ?Modified Rankin (Stroke Patients Only) ?Modified Rankin (Stroke Patients Only) ?Pre-Morbid Rankin Score: Moderate disability (3 prior to recent previous hospital admission) ?Modified Rankin: Severe disability ? ?  ? ?Balance Overall balance assessment: Needs assistance ?Sitting-balance support: Single extremity supported, Bilateral upper extremity supported, Feet supported ?Sitting balance-Leahy Scale: Poor ?Sitting balance - Comments: R lateral lean initially, corrects with visual and verbal cues ?Postural control: Right lateral lean ?  ?  ?  ?  ?  ?  ?  ?  ?  ?  ?  ?  ?  ?  ?   ? ? ? ?  Pertinent Vitals/Pain Pain Assessment ?Pain Assessment: No/denies pain  ? ? ?Home Living Family/patient expects to be discharged to:: Private residence ?Living Arrangements: Children (SIL, 4 grandkids, pets) ?Available Help at Discharge:  Family;Available PRN/intermittently ?Type of Home: House ?Home Access: Level entry ?  ?  ?Alternate Level Stairs-Number of Steps: Stair lift to bedroom upstairs ?Home Layout: Multi-level (4 levels) ?Home Equipment: Wheelchair - Systems analyst (2 wheels);Hospital bed;Hand held shower head ?   ?  ?Prior Function Prior Level of Function : Independent/Modified Independent ?  ?  ?  ?  ?  ?  ?Mobility Comments: Using his wheelchair mainly. Use of walker for short distances and transfers. Prior to last hospitalization, he did not require DME ?ADLs Comments: family helping with ADLs due to strength and fatigue. Up watch tv during the day. Prior to last hospitalization (dc on good friday), was performing all ADLs. ?  ? ? ?Hand Dominance  ?   ? ?  ?Extremity/Trunk Assessment  ? Upper Extremity Assessment ?Upper Extremity Assessment: Generalized weakness ?  ? ?Lower Extremity Assessment ?Lower Extremity Assessment: Generalized weakness ?  ? ?Cervical / Trunk Assessment ?Cervical / Trunk Assessment: Kyphotic;Other exceptions ?Cervical / Trunk Exceptions: R lateral cervical flexion and rotation  ?Communication  ? Communication: Other (comment) (soft spoken)  ?Cognition Arousal/Alertness: Awake/alert ?Behavior During Therapy: Flat affect ?Overall Cognitive Status: Impaired/Different from baseline ?Area of Impairment: Attention, Memory, Following commands, Safety/judgement, Awareness, Problem solving ?  ?  ?  ?  ?  ?  ?  ?  ?  ?Current Attention Level: Sustained, Focused ?Memory: Decreased short-term memory ?Following Commands: Follows one step commands with increased time, Follows one step commands inconsistently ?Safety/Judgement: Decreased awareness of safety, Decreased awareness of deficits ?Awareness: Intellectual ?Problem Solving: Slow processing, Requires verbal cues ?  ?  ?  ? ?  ?General Comments General comments (skin integrity, edema, etc.): Pt with spontaneous L beating and downward beating  nystagmus during session. Reports intermittent dizziness. VSS, pt on 5L [place] sats stable in mid 90s, HR in low 100s ? ?  ?Exercises    ? ?Assessment/Plan  ?  ?PT Assessment Patient needs continued PT services  ?PT Problem List Decreased strength;Decreased balance;Decreased activity tolerance;Decreased mobility;Decreased cognition;Cardiopulmonary status limiting activity ? ?   ?  ?PT Treatment Interventions DME instruction;Gait training;Functional mobility training;Therapeutic activities;Therapeutic exercise;Balance training;Neuromuscular re-education;Cognitive remediation;Patient/family education;Wheelchair mobility training   ? ?PT Goals (Current goals can be found in the Care Plan section)  ?Acute Rehab PT Goals ?Patient Stated Goal: to regain strength and progress to baseline prior to last hospital admission (walking and performing ADLs independently) ?PT Goal Formulation: With family ?Time For Goal Achievement: 01/29/22 ?Potential to Achieve Goals: Fair ? ?  ?Frequency Min 4X/week ?  ? ? ?Co-evaluation PT/OT/SLP Co-Evaluation/Treatment: Yes ?Reason for Co-Treatment: Complexity of the patient's impairments (multi-system involvement);Necessary to address cognition/behavior during functional activity;For patient/therapist safety;To address functional/ADL transfers ?PT goals addressed during session: Mobility/safety with mobility;Balance;Strengthening/ROM ?  ?  ? ? ?  ?AM-PAC PT "6 Clicks" Mobility  ?Outcome Measure Help needed turning from your back to your side while in a flat bed without using bedrails?: Total ?Help needed moving from lying on your back to sitting on the side of a flat bed without using bedrails?: Total ?Help needed moving to and from a bed to a chair (including a wheelchair)?: Total ?Help needed standing up from a chair using your arms (e.g., wheelchair or bedside chair)?: Total ?Help needed to walk in hospital room?: Total ?Help  needed climbing 3-5 steps with a railing? : Total ?6 Click Score:  6 ? ?  ?End of Session Equipment Utilized During Treatment: Oxygen ?Activity Tolerance: Patient limited by fatigue ?Patient left: in bed;with call bell/phone within reach;with bed alarm set;with family/visitor presen

## 2022-01-15 NOTE — Progress Notes (Signed)
ANTICOAGULATION CONSULT NOTE ? ?Pharmacy Consult for Heparin ?Indication: pulmonary embolus ? ?Allergies  ?Allergen Reactions  ? Lisinopril Anaphylaxis  ? Amoxicillin Nausea And Vomiting  ? Aripiprazole Other (See Comments)  ?  Made patient feel "really low" per daughter at bedside  ? Olmesartan Other (See Comments)  ?  "bothered stomach"  ? Shellfish Allergy Nausea Only  ?  "Bad headache"  ? ? ?Patient Measurements: ?Height: '5\' 9"'$  (175.3 cm) ?Weight: 71.1 kg (156 lb 12 oz) ?IBW/kg (Calculated) : 70.7 ?Heparin Dosing Weight: TBW ? ?Vital Signs: ?Temp: 98.1 ?F (36.7 ?C) (04/14 1200) ?Temp Source: Axillary (04/14 1200) ?BP: 117/82 (04/14 1100) ?Pulse Rate: 108 (04/14 1100) ? ?Labs: ?Recent Labs  ?  01/19/2022 ?1739 01/14/22 ?0416 01/14/22 ?0034 01/14/22 ?1039 01/14/22 ?1820 01/15/22 ?0309 01/15/22 ?1147  ?HGB 8.8*  --  7.5*  --   --  7.3* 7.4*  ?HCT 26.9*  --  22.6*  --   --  22.6* 22.9*  ?PLT 151  --  108*  --   --  135* 154  ?LABPROT  --  24.9*  --   --   --   --   --   ?INR  --  2.3*  --   --   --   --   --   ?HEPARINUNFRC  --   --   --    < > <0.10* <0.10* <0.10*  ?CREATININE 1.11  --  0.78  --   --  0.98  --   ? < > = values in this interval not displayed.  ? ? ?Estimated Creatinine Clearance: 80.2 mL/min (by C-G formula based on SCr of 0.98 mg/dL). ? ?Assessment: ?61 yo M with metastatic pancreatic cancer admitted with sepsis and acute metabolic encephalopathy. Pt on heparin for bilateral PE with right heart strain and acute bilateral DVTs. Pt also with large right cerebellar stroke. ? ?Neurology and primary team in agreement with heparin due to large clot burden even with new large stroke. Request no bolus and low goal. ? ?Heparin level remains undetectable. No issues with infusion per discussion with RN. Previous reports of hematuria but none so far today per RN. CBC stable. ? ?Goal of Therapy:  ?Heparin level 0.3-0.5 units/hr ?Monitor platelets by anticoagulation protocol: Yes ?  ?Plan:   ?No bolus per  protocol. Increase heparin to 2100 units/hr ?Check heparin level in 6hrs ?Monitor daily CBC, s/sx bleeding ? ? ?Arturo Morton, PharmD, BCPS ?Please check AMION for all [place] contact numbers ?Clinical Pharmacist ?01/15/2022 2:03 PM ?

## 2022-01-15 NOTE — Progress Notes (Signed)
?  Transition of Care (TOC) Screening Note ? ? ?Patient Details  ?Name: Michael Kemp ?Date of Birth: 1961-09-28 ? ? ?Transition of Care [facility]) CM/SW Contact:    ?Ella Bodo, RN ?Phone Number: ?01/15/2022, 4:55 PM ? ? ? ?Transition of Care Department [facility]) has reviewed patient and no TOC needs have been identified at this time. We will continue to monitor patient advancement through interdisciplinary progression rounds. If new patient transition needs arise, please place a TOC consult. ? ?Reinaldo Raddle, RN, BSN  ?Trauma/Neuro ICU Case Manager ?[phone] ? ?

## 2022-01-15 NOTE — Progress Notes (Signed)
SLP Cancellation Note ? ?Patient Details ?Name: Michael Kemp ?MRN: [id number] ?DOB: 03-Jan-1961 ? ? ?Cancelled treatment:       Reason Eval/Treat Not Completed: Patient at procedure or test/unavailable (Pt leaving unit for CT. SLP will follow up.) ? ?Saba Neuman I. Hardin Negus, [place], CCC-SLP ?Acute Rehabilitation Services ?Office number [phone] ?Pager [phone] ? ?[name] ?01/15/2022, 9:19 AM ?

## 2022-01-16 ENCOUNTER — Inpatient Hospital Stay (HOSPITAL_COMMUNITY): Payer: Medicaid Other

## 2022-01-16 DIAGNOSIS — I2699 Other pulmonary embolism without acute cor pulmonale: Secondary | ICD-10-CM | POA: Diagnosis not present

## 2022-01-16 DIAGNOSIS — A419 Sepsis, unspecified organism: Secondary | ICD-10-CM | POA: Diagnosis not present

## 2022-01-16 DIAGNOSIS — J9601 Acute respiratory failure with hypoxia: Secondary | ICD-10-CM

## 2022-01-16 DIAGNOSIS — I639 Cerebral infarction, unspecified: Secondary | ICD-10-CM | POA: Diagnosis not present

## 2022-01-16 DIAGNOSIS — A4101 Sepsis due to Methicillin susceptible Staphylococcus aureus: Secondary | ICD-10-CM

## 2022-01-16 DIAGNOSIS — I82413 Acute embolism and thrombosis of femoral vein, bilateral: Secondary | ICD-10-CM | POA: Diagnosis not present

## 2022-01-16 LAB — BASIC METABOLIC PANEL
Anion gap: 6 (ref 5–15)
BUN: 35 mg/dL — ABNORMAL HIGH (ref 6–20)
CO2: 20 mmol/L — ABNORMAL LOW (ref 22–32)
Calcium: 7.5 mg/dL — ABNORMAL LOW (ref 8.9–10.3)
Chloride: 129 mmol/L — ABNORMAL HIGH (ref 98–111)
Creatinine, Ser: 0.99 mg/dL (ref 0.61–1.24)
GFR, Estimated: >60 mL/min (ref >60)
Glucose, Bld: 197 mg/dL — ABNORMAL HIGH (ref 70–99)
Potassium: 3.5 mmol/L (ref 3.5–5.1)
Sodium: 155 mmol/L — ABNORMAL HIGH (ref 135–145)

## 2022-01-16 LAB — POCT I-STAT 7, (LYTES, BLD GAS, ICA,H+H)
Acid-base deficit: 5 mmol/L — ABNORMAL HIGH (ref 0.0–2.0)
Acid-base deficit: 5 mmol/L — ABNORMAL HIGH (ref 0.0–2.0)
Bicarbonate: 19.1 mmol/L — ABNORMAL LOW (ref 20.0–28.0)
Bicarbonate: 20.4 mmol/L (ref 20.0–28.0)
Calcium, Ion: 1.22 mmol/L (ref 1.15–1.40)
Calcium, Ion: 1.23 mmol/L (ref 1.15–1.40)
HCT: 26 % — ABNORMAL LOW (ref 39.0–52.0)
HCT: 25 % — ABNORMAL LOW (ref 39.0–52.0)
Hemoglobin: 8.8 g/dL — ABNORMAL LOW (ref 13.0–17.0)
Hemoglobin: 8.5 g/dL — ABNORMAL LOW (ref 13.0–17.0)
O2 Saturation: 88 %
O2 Saturation: 98 %
Patient temperature: 98.4
Patient temperature: 98.6
Potassium: 3.6 mmol/L (ref 3.5–5.1)
Potassium: 4.1 mmol/L (ref 3.5–5.1)
Sodium: 157 mmol/L — ABNORMAL HIGH (ref 135–145)
Sodium: 159 mmol/L — ABNORMAL HIGH (ref 135–145)
TCO2: 20 mmol/L — ABNORMAL LOW (ref 22–32)
TCO2: 22 mmol/L (ref 22–32)
pCO2 arterial: 32.4 mmHg (ref 32–48)
pCO2 arterial: 39.2 mmHg (ref 32–48)
pH, Arterial: 7.377 (ref 7.35–7.45)
pH, Arterial: 7.325 — ABNORMAL LOW (ref 7.35–7.45)
pO2, Arterial: 55 mmHg — ABNORMAL LOW (ref 83–108)
pO2, Arterial: 121 mmHg — ABNORMAL HIGH (ref 83–108)

## 2022-01-16 LAB — CBC
HCT: 23.6 % — ABNORMAL LOW (ref 39.0–52.0)
Hemoglobin: 7.7 g/dL — ABNORMAL LOW (ref 13.0–17.0)
MCH: 28.8 pg (ref 26.0–34.0)
MCHC: 32.6 g/dL (ref 30.0–36.0)
MCV: 88.4 fL (ref 80.0–100.0)
Platelets: 140 10*3/uL — ABNORMAL LOW (ref 150–400)
RBC: 2.67 MIL/uL — ABNORMAL LOW (ref 4.22–5.81)
RDW: 17.1 % — ABNORMAL HIGH (ref 11.5–15.5)
WBC: 23.9 10*3/uL — ABNORMAL HIGH (ref 4.0–10.5)
nRBC: 0.9 % — ABNORMAL HIGH (ref 0.0–0.2)

## 2022-01-16 LAB — GLUCOSE, CAPILLARY
Glucose-Capillary: 169 mg/dL — ABNORMAL HIGH (ref 70–99)
Glucose-Capillary: 164 mg/dL — ABNORMAL HIGH (ref 70–99)
Glucose-Capillary: 175 mg/dL — ABNORMAL HIGH (ref 70–99)
Glucose-Capillary: 157 mg/dL — ABNORMAL HIGH (ref 70–99)
Glucose-Capillary: 115 mg/dL — ABNORMAL HIGH (ref 70–99)

## 2022-01-16 LAB — LIPID PANEL
Cholesterol: 66 mg/dL (ref 0–200)
HDL: 14 mg/dL — ABNORMAL LOW (ref >40)
LDL Cholesterol: 41 mg/dL (ref 0–99)
Total CHOL/HDL Ratio: 4.7 RATIO
Triglycerides: 55 mg/dL (ref <150)
VLDL: 11 mg/dL (ref 0–40)

## 2022-01-16 LAB — HEPARIN LEVEL (UNFRACTIONATED)
Heparin Unfractionated: 0.18 IU/mL — ABNORMAL LOW (ref 0.30–0.70)
Heparin Unfractionated: 0.2 IU/mL — ABNORMAL LOW (ref 0.30–0.70)

## 2022-01-16 LAB — SODIUM
Sodium: 155 mmol/L — ABNORMAL HIGH (ref 135–145)
Sodium: 154 mmol/L — ABNORMAL HIGH (ref 135–145)
Sodium: 158 mmol/L — ABNORMAL HIGH (ref 135–145)

## 2022-01-16 LAB — APTT
aPTT: 90 seconds — ABNORMAL HIGH (ref 24–36)
aPTT: 86 seconds — ABNORMAL HIGH (ref 24–36)

## 2022-01-16 MED ORDER — NOREPINEPHRINE 4 MG/250ML-% IV SOLN
0.0000 ug/min | INTRAVENOUS | Status: DC
  Administered 2022-01-16: 2 ug/min via INTRAVENOUS
  Administered 2022-01-17: 12 ug/min via INTRAVENOUS
  Administered 2022-01-17: 8 ug/min via INTRAVENOUS
  Administered 2022-01-17: 16 ug/min via INTRAVENOUS
  Administered 2022-01-18: 22 ug/min via INTRAVENOUS
  Administered 2022-01-18: 19 ug/min via INTRAVENOUS
  Administered 2022-01-18: 20 ug/min via INTRAVENOUS
  Filled 2022-01-16 (×7): qty 250

## 2022-01-16 MED ORDER — FUROSEMIDE 10 MG/ML IJ SOLN
60.0000 mg | Freq: Once | INTRAMUSCULAR | Status: AC
  Administered 2022-01-16: 60 mg via INTRAVENOUS
  Filled 2022-01-16: qty 6

## 2022-01-16 MED ORDER — SODIUM CHLORIDE 0.9 % IV SOLN
0.0600 mg/kg/h | INTRAVENOUS | Status: DC
  Administered 2022-01-16: 0.1 mg/kg/h via INTRAVENOUS
  Filled 2022-01-16: qty 250

## 2022-01-16 MED ORDER — SODIUM CHLORIDE 0.9 % IV SOLN: INTRAVENOUS | Status: DC | PRN

## 2022-01-16 MED ORDER — SODIUM CHLORIDE 0.9 % IV SOLN
3.0000 g | Freq: Four times a day (QID) | INTRAVENOUS | Status: DC
  Administered 2022-01-16 – 2022-01-18 (×8): 3 g via INTRAVENOUS
  Filled 2022-01-16 (×8): qty 8

## 2022-01-16 NOTE — Progress Notes (Addendum)
? ?NAME:  Michael Kemp, MRN:  [id number], DOB:  1961/07/10, LOS: 3 ?ADMISSION DATE:  01/04/2022, CONSULTATION DATE:  01/14/2022 ?REFERRING MD:  Dr. Benny Lennert, CHIEF COMPLAINT:  Code stroke   ? ?History of Present Illness:  ?61 y/o M with a PMH significant for pancreatic adenocarcinoma with mets to the liver and recent leptomeningeal spead on MRI brain/spine at [facility], diabetes, HTN, GERD, and failure to thrive who presented to the [facility] ED for sudden onset confusion.  ? ?On ED arrival patient was seen with stable vital signs but new underlying confusion persisted. Labwork significant for leukocytosis, anemia, AKI, and elevated liver enzymes.  CT angio chest/abdomen/pelvis obtained on arrival and revealed bilateral pulmonary emboli with CT evidence of right heart strain as well as known distal pancreatic tail mass with mild ascites and splenic venous occlusion of chronic nature.  Head CT on admit negative ? 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 ?Given multiple medical conditions with new acute strokes neurology and critical care was consulted to assist in further care. Patient will be transferred to [facility] for further neurologic care.  ? ?Pertinent  Medical History  ?Pancreatic adenocarcinoma with mets to the liver and recent leptomeningeal spear on MRI brain/spine at [facility], diabetes, HTN, GERD, and failure to thrive ? ?Significant Hospital Events: ?Including procedures, antibiotic start and stop dates in addition to other pertinent events   ?4/12 admitted with new onset confusion found to have bilateral PE with CT evidence of right heart strain  ?4/13 MRI brain was completed and revealed acute to early subacute right PICA infarct with mild edema and partial effacement of fourth  ventricle, scattered additional smaller infarcts, potentially occluded proximal right V4 segment flow, and known leptomeningeal disease. PCCM consulted ?4/15 CTH shows stable R PICA infarct, no hemorrhagic transformation, worsening hypoxic failure and R-sided PNA, likely aspirating, change to Unasyn/Vanc ? ?Interim History / Subjective:  ? ?No overnight events ?Sleepy but arousable this morning ? ? ?Objective   ?Blood pressure 116/78, pulse (!) 123, temperature 98.3 ?F (36.8 ?C), temperature source Axillary, resp. rate (!) 23, height '5\' 9"'$  (1.753 m), weight 71.1 kg, SpO2 92 %. ?   ?   ? ?Intake/Output Summary (Last 24 hours) at 01/16/2022 0749 ?Last data filed at 01/16/2022 0600 ?Gross per 24 hour  ?Intake 3692.56 ml  ?Output 875 ml  ?Net 2817.56 ml  ? ? ?Filed Weights  ? 01/02/2022 1958 01/14/22 0100  ?Weight: 66.2 kg 71.1 kg  ? ? ? ?General:  thin, poorly nourished M, sleeping in no acute distress ?HEENT: MM pink/moist, sclera anicteric and pupils equal ?Neuro: wakens from sleep to voice, following commands but not conversational this morning, appears fatigued ?CV: s1s2 tachycardic, regular, no m/r/g ?PULM:  tachypneic with shallow inspirations, increasing O2 requirement, placed on non-rebreather ?GI: soft, bsx4 active  ?Extremities: warm/dry, 1+ pre-tibial edema  ?Skin: no rashes or lesions ? ? ? ?Labs reviewed ?WBC up-trending slightly to 23.9 ?Hgb 7.1 ?Na 155 ?Chl 129 ?Glu 197 ? ?Resolved Hospital Problem list   ? ? ?Assessment & Plan:  ? ? ?Acute Hypoxic Respiratory Failure ?Worsening today in the setting of stroke and failed MBS and swallow study ?-placed on non-rebreather ?-multiple GOC talks completed prior, today and pt and daughter confirm would want intubation ?-stat CXR and ABG ? ? ? ? ?  Severe Sepsis (POA) ?Possible intraabdominal process vs pneumonia  ?CT abd/pelvis 4/13 without obvious source ?-continue Unasyn, Vanc ?-cultures without growth so far ?-MAP goal >65, not requiring pressors  ?-follow for  evidence of end organ dysfunction  ? ?Acute bilateral PE with CT evidence of right heart strain  ?Bilateral acute lower extremity DVT  ?-continue heparin gtt per pharmacy  ?-high risk for intracranial bleeding given new strokes, however CTH without hemorrhagic transformation 4/15 ?-follow neuro exam  ?-pending clinical course, might need IVC filter ? 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 ?Stage IV Metastatic pancreatic cancer with mets to the liver and brain  ?Elevated liver enzymes  ?Hypoalbuminemia  ?Followed at [facility], was receiving palliative Folfirinox at [facility]  ?-supportive care  ?-avoid hepatotoxins  ? ?Severe protein calorie malnutrition  ?Failure to thrive  ?At Risk Aspiration  ?-PT/OT ?-failed MBS, remains NPO, continue TF ?-aspiration precautions  ? ?Stage II sacral pressure injury  ?-wound care as appropriate ?-pressure alleviating measures  ? ?Elevated INR  ?Anemia ?Thrombocytopaenia  ?-high risk ICH / hemorrhagic conversion given thrombocytopenia & need for anticoagulation for acute DVT/PE  ?-monitor closely while on heparin  ? ?Type 2 diabetes  ?A1C 5.3 ?-SSI ?-Glucose goal 140-180  ? ?Best Practice (right click and "Reselect all SmartList Selections" daily)  ?Diet/type: TF, NPO ?DVT prophylaxis: systemic heparin ?GI prophylaxis: PPI ?Lines: N/A ?Foley:  N/A ?Code Status:  full code ?Last date of multidisciplinary goals of care discussion: Per neurology and palliative care family wishes to proceed with full scope of  care and to remain full code.  Last meeting 4/14 with Dr. Rowe Pavy.    4/15 Per daughter and patient, continue full code ? ?Critical care time:  40 minutes   ? ? ?CRITICAL CARE ?Performed by: Otilio Carpen Marvelle Span ? ? ?Total critical care time: 40 minutes ? ?Critical care time was exclusive of separately billable procedures and treating other patients. ? ?Critical care was necessary to treat or prevent imminent or life-threatening deterioration. ? ?Critical care was time spent personally by me on the following activities: development of treatment plan with patient and/or surrogate as well as nursing, discussions with consultants, evaluation of patient's response to treatment, examination of patient, obtaining history from patient or surrogate, ordering and performing treatments and interventions, ordering and review of laboratory studies, ordering and review of radiographic studies, pulse oximetry and re-evaluation of patient's condition. ? ? ? ?Otilio Carpen Smita Lesh, PA-C ?[facility] ?See Amion for pager ?If no response to pager , please call [phone] until 7pm ?After 7:00 pm call Elink  [phone] ? ? ? ? ?

## 2022-01-16 NOTE — Progress Notes (Addendum)
eLink Physician-Brief Progress Note ?Patient Name: Michael Kemp ?DOB: [birth year] ?MRN: [id number] ? ? ?Date of Service ? 01/16/2022  ?HPI/Events of Note ? RN requesting NTS PRN d/t thick secretions, unable to clear.  ? ?HR remains tachy at 120-130, on Levo @ 3 mcg. ? ?Camera: ?On low dose levophed. ?Thick secretions, not able to cough up, but not able to get it up. ?100 sats on NRB. ?  ?eICU Interventions ? NTS once and prn. ?Full code still.  ?Metastatic cancer. ?Get ABG also.  ?Asp precautions  ? ? ? ?Intervention Category ?Intermediate Interventions: Respiratory distress - evaluation and management ? ?Elmer Sow ?01/16/2022, 10:16 PM ? ? ?0500 ?Pt oliguric, only 100 cc since shift change. Bladder sca only showed 150 cc. Only getting KVO NS ?- follow creatinine/BMP in AM, if going up will start fluids.  ? ?

## 2022-01-16 NOTE — Progress Notes (Addendum)
STROKE TEAM PROGRESS NOTE  ? ?INTERVAL HISTORY ?Patient is seen in his room with his sister at the bedside.  His respirations have become tachypnenic and labored, and he may be intubated by CCM later today.  His family wishes to pursue aggressive measures, and he remains full code.  Heparin was changed to bivalrudin due to inability to achieve therapeutic levels.  ? ?Vitals:  ? 01/16/22 1200 01/16/22 1255 01/16/22 1300 01/16/22 1400  ?BP: 114/80  111/76 107/76  ?Pulse: (!) 124 (!) 129 (!) 123 (!) 135  ?Resp: (!) '21 20 19 '$ (!) 29  ?Temp: 99.6 ?F (37.6 ?C)     ?TempSrc: Axillary     ?SpO2: 99% 99% 98% 98%  ?Weight:      ?Height:      ? ?CBC:  ?Recent Labs  ?Lab 01/14/22 ?3295 01/15/22 ?0309 01/15/22 ?1147 01/16/22 ?1884 01/16/22 ?1660  ?WBC 21.0* 19.7* 22.1* 23.9*  --   ?NEUTROABS 17.6* 17.7*  --   --   --   ?HGB 7.5* 7.3* 7.4* 7.7* 8.8*  ?HCT 22.6* 22.6* 22.9* 23.6* 26.0*  ?MCV 85.9 86.9 87.7 88.4  --   ?PLT 108* 135* 154 140*  --   ? ? ?Basic Metabolic Panel:  ?Recent Labs  ?Lab 01/14/22 ?6301 01/14/22 ?1353 01/15/22 ?0309 01/15/22 ?6010 01/16/22 ?9323 01/16/22 ?5573 01/16/22 ?1018  ?NA 138   < > 144   < > 155* 157* 155*  ?K 4.1  --  3.6  --  3.5 3.6  --   ?CL 103  --  114*  --  129*  --   --   ?CO2 27  --  23  --  20*  --   --   ?GLUCOSE 107*  --  129*  --  197*  --   --   ?BUN 30*  --  32*  --  35*  --   --   ?CREATININE 0.78  --  0.98  --  0.99  --   --   ?CALCIUM 7.6*  --  7.3*  --  7.5*  --   --   ?MG 1.9  --   --   --   --   --   --   ?PHOS 4.7*  --   --   --   --   --   --   ? < > = values in this interval not displayed.  ? ? ?Lipid Panel:  ?Recent Labs  ?Lab 01/16/22 ?0312  ?CHOL 66  ?TRIG 55  ?HDL 14*  ?CHOLHDL 4.7  ?VLDL 11  ?[place] 41  ? ?HgbA1c:  ?Recent Labs  ?Lab 01/14/22 ?0416  ?HGBA1C 5.8*  ? ? ?Urine Drug Screen:  ?Recent Labs  ?Lab 01/25/2022 ?2325  ?LABOPIA NONE DETECTED  ?COCAINSCRNUR NONE DETECTED  ?LABBENZ NONE DETECTED  ?AMPHETMU NONE DETECTED  ?THCU NONE DETECTED  ?LABBARB NONE DETECTED  ? ?   ?Alcohol Level No results for input(s): ETH in the last 168 hours. ? ?IMAGING past 24 hours ?CT HEAD WO CONTRAST (5MM) ? ?Result Date: 01/16/2022 ?CLINICAL DATA:  61 year old male with neurologic deficit. Metastatic pancreatic cancer. Right PICA infarct with 4th ventricle mass effect. Right vertebral artery occlusion. EXAM: CT HEAD WITHOUT CONTRAST TECHNIQUE: Contiguous axial images were obtained from the base of the skull through the vertex without intravenous contrast. RADIATION DOSE REDUCTION: This exam was performed according to the departmental dose-optimization program which includes automated exposure control, adjustment of the mA and/or kV according to patient size  and/or use of iterative reconstruction technique. COMPARISON:  Head CT 01/15/2022. Brain MRI 01/14/2022 and earlier. FINDINGS: Brain: Relatively large right cerebellum PICA territory infarct with confluent cytotoxic edema. No hemorrhagic transformation. Partial effacement of the 4th ventricle appears stable and basilar cisterns remain patent. No lateral or 3rd ventriculomegaly. No supratentorial midline shift, mass effect, evidence of mass lesion, intracranial hemorrhage. The scattered small bilateral cerebral hemisphere infarcts seen by MRI remain largely occult by CT. Evidence of cortically based acute infarction. Vascular: Calcified atherosclerosis at the skull base. Skull: Intact, negative. Sinuses/Orbits: Visualized paranasal sinuses and mastoids are clear. Other: No acute orbit or scalp soft tissue finding. IMPRESSION: 1. Stable relatively large Right PICA infarct. No hemorrhagic transformation or progressive mass effect. Basilar cisterns remain patent, no ventriculomegaly. 2. Scattered smaller bilateral cerebral hemisphere infarcts remain largely occult by CT. 3. No new intracranial abnormality. Electronically Signed   By: Genevie Ann M.D.   On: 01/16/2022 06:04  ? ?DG CHEST PORT 1 VIEW ? ?Result Date: 01/16/2022 ?CLINICAL DATA:  Weakness and  rhonchi. EXAM: PORTABLE CHEST 1 VIEW COMPARISON:  January 13, 2022 FINDINGS: The heart size and mediastinal contours are stable. Right central venous line is unchanged with distal tip in the right atrium. Patchy consolidation throughout the right lung more prominently involving the right lung base is identified. Patchy consolidation of left lung base is noted. Probable small right pleural effusion is noted. The visualized skeletal structures are unremarkable. IMPRESSION: Bilateral pneumonias, right greater than left. Electronically Signed   By: Abelardo Diesel M.D.   On: 01/16/2022 10:05   ? ?PHYSICAL EXAM ?General:  Thin-cachectic appearing appearing, African-American male ?Respiratory:  Labored, tachypneic respirations on nonrebreather ? ?NEURO:  ?Mental Status: Drowsy but arousable.  Oriented to person only, able to intermittently follow commands ?Speech/Language: speech is with some dysarthria, no aphasia.   ? ?Cranial Nerves:  ?II: PERRL.  ?III, IV, VI: Right gaze preference. Nystagmus not seen as patient unable to make rapid eye movements ?VII: Smile is symmetrical.  ?VIII: hearing intact to voice. ?IX, X:  Phonation is normal.  ?Motor: Able to move all extremities to command ?Tone: is normal and bulk is normal ?Sensation- Intact to light touch bilaterally.   ?Coordination: FTN with ataxia worse on right ?Gait- deferred ? ? ? ?ASSESSMENT/PLAN ?Michael Kemp is a 61 y.o. male with history of pancreatic cancer with metastases to the liver and brain and HTN presenting with acute onset vertigo, nausea and vomiting.  He presented to the ED and was found to have an acute large right cerebellar stroke.  He was also found to have acute bilateral PEs and DVTs along with sepsis from an unclear source.  He was started on IV heparin for the PEs and DVTs and was given 3% HTS to treat cerebellar edema caused by his stroke. Na is within therapeutic range, HTS stopped.  Heparin was changed to bivalrudin due to inability to  obtain therapeutic level.  Patient's respirations have become tachypneic and labored, and he will likely be intubated today.  Family wishes to continue aggressive measures. ? ?Stroke:  right large PICA infarct with cytotoxic edema and mass effect, likely secondary to hypercoagulability from metastatic pancreatic cancer  ?CT head evolving acute right PICA territory infarct in right cerebellar hemisphere with posterior fossa mass effect but no hydrocephalus ?CTA head & neck occlusion of right vertebral artery at C2 level, occluded right PICA, enlargement of right lobe of thyroid gland ?CT perfusion 75m region of hypoperfused parenchyma in  right cerebellar hemisphere ?MRI  acute to early subacute right PICA infarct with mild edema and partial effacement of fourth ventricle, no hydrocephalus, scattered additional infarcts in bilateral cerebral and cerebellar hemispheres ?2D Echo EF 60-65%moderate LVH, normal atrial septum ?LDL No results found for requested labs within last 26280 hours. ?HgbA1c 5.8 ?VTE prophylaxis - fully anticoagulated with bivalrudin ?No antithrombotic prior to admission, now on bivalrudin given difficulty with heparin level ?Therapy recommendations:  pending ?Disposition:  pending ? ?Respiratory Failure ?Patient failed swallow study and has likely aspirated ?CXR shows bilateral pneumonia ?Respirations labored and tachypneic ?Will likely be intubated by CCM today ? ?Hypertension ?Home meds:  none ?Stable ?Permissive hypertension (OK if < 220/120) but gradually normalize in 5-7 days ?Long-term BP goal normotensive ? ?Hyperlipidemia ?Home meds:  none,  ?LDL No results found for requested labs within last 26280 hours., goal < 70 ?Add statin if appropriate ?Continue statin at discharge ? ?Sepsis with uncertain source ?Follow cultures ?Antibiotics per primary team ?MAP goal >65 ? ?Acute bilateral PE with bilateral lower extremity DVT ?Bilateral DVTs seen on lower extremity doppler ?Acute bilateral PE with  evidence of right heart strain seen on CT chest ?anticoagulated with IV heparin -> bivalrudin now ?Management per primary team ? ?Pancreatic adenocarcinoma with mets to the liver and brain with possible

## 2022-01-16 NOTE — Progress Notes (Signed)
ANTICOAGULATION CONSULT NOTE - Follow Up Consult ? ?Pharmacy Consult for heparin ?Indication:  PE/DVT in setting of CVA ? ?Labs: ?Recent Labs  ?  01/03/2022 ?1739 01/14/22 ?0416 01/14/22 ?8119 01/14/22 ?1039 01/15/22 ?0309 01/15/22 ?1147 01/15/22 ?2126 01/16/22 ?1478  ?HGB 8.8*  --  7.5*  --  7.3* 7.4*  --  7.7*  ?HCT 26.9*  --  22.6*  --  22.6* 22.9*  --  23.6*  ?PLT 151  --  108*  --  135* 154  --  140*  ?LABPROT  --  24.9*  --   --   --   --   --   --   ?INR  --  2.3*  --   --   --   --   --   --   ?HEPARINUNFRC  --   --   --    < > <0.10* <0.10* 0.13* 0.18*  ?CREATININE 1.11  --  0.78  --  0.98  --   --   --   ? < > = values in this interval not displayed.  ? ? ?Assessment: ?61yo male subtherapeutic on heparin after rate change; no infusion issues or signs of bleeding per RN. ? ?Goal of Therapy:  ?Heparin level 0.3-0.5 units/ml ?  ?Plan:  ?Will increase heparin infusion by 3 units/kg/hr to 2500 units/hr and check level in 6 hours.   ? ?Wynona Neat, PharmD, BCPS  ?01/16/2022,4:35 AM ? ? ?

## 2022-01-16 NOTE — Progress Notes (Signed)
Inpatient Rehab Admissions Coordinator:  ? ?Per therapy recommendations, patient was screened for CIR candidacy by Clemens Catholic, MS, CCC-SLP. At this time, Pt. is not yet at a level where I believe he could tolerate the intensity of CIR; however,  Pt. may have potential to progress to becoming a potential CIR candidate, so CIR admissions team will follow and monitor for progress and participation with therapies and place consult order if Pt. appears to be an appropriate candidate. Please contact me with any questions.  ? ?Clemens Catholic, MS, CCC-SLP ?Rehab Admissions Coordinator  ?[phone] (celll) ?[phone] (office) ? ? ?

## 2022-01-16 NOTE — Progress Notes (Signed)
ANTICOAGULATION CONSULT NOTE- follow-up ? ?Pharmacy Consult for bivalirudin ?Indication: pulmonary embolus, DVT, and stroke ? ?Allergies  ?Allergen Reactions  ? Lisinopril Anaphylaxis  ? Amoxicillin Nausea And Vomiting  ? Aripiprazole Other (See Comments)  ?  Made patient feel "really low" per daughter at bedside  ? Olmesartan Other (See Comments)  ?  "bothered stomach"  ? Shellfish Allergy Nausea Only  ?  "Bad headache"  ? ? ?Patient Measurements: ?Height: '5\' 9"'$  (175.3 cm) ?Weight: 71.1 kg (156 lb 12 oz) ?IBW/kg (Calculated) : 70.7 ? ?Vital Signs: ?Temp: 98.2 ?F (36.8 ?C) (04/15 1600) ?Temp Source: Axillary (04/15 1600) ?BP: 107/76 (04/15 1400) ?Pulse Rate: 143 (04/15 1548) ? ?Labs: ?Recent Labs  ?  01/14/22 ?0416 01/14/22 ?8119 01/14/22 ?1039 01/15/22 ?0309 01/15/22 ?1147 01/15/22 ?2126 01/16/22 ?1478 01/16/22 ?2956 01/16/22 ?1018 01/16/22 ?1611  ?HGB  --  7.5*  --  7.3* 7.4*  --  7.7* 8.8*  --   --   ?HCT  --  22.6*  --  22.6* 22.9*  --  23.6* 26.0*  --   --   ?PLT  --  108*  --  135* 154  --  140*  --   --   --   ?APTT  --   --   --   --   --   --   --   --   --  90*  ?LABPROT 24.9*  --   --   --   --   --   --   --   --   --   ?INR 2.3*  --   --   --   --   --   --   --   --   --   ?HEPARINUNFRC  --   --    < > <0.10* <0.10* 0.13* 0.18*  --  0.20*  --   ?CREATININE  --  0.78  --  0.98  --   --  0.99  --   --   --   ? < > = values in this interval not displayed.  ? ? ?Estimated Creatinine Clearance: 79.3 mL/min (by C-G formula based on SCr of 0.99 mg/dL). ? ?Assessment: ?61 yo M with metastatic pancreatic cancer admitted with sepsis and acute metabolic encephalopathy. Pt started on heparin for bilateral PE with right heart strain and acute bilateral DVTs. Pt also with large right cerebellar stroke. Neurology and primary team in agreement with anticoagulation due to large clot burden even with new large stroke. Request no bolus and low goal. ? ?Heparin level subtherapeutic at 0.2 on 2500 units/hr (35 units/kg).  Spoke with Dr Erlinda Hong about changing agent to bivalirudin given subtherapeutic heparin levels and high rate - ok to change. No bleeding noted, Hgb up to 8.8, platelets are low at 140.  ? ?Goal of Therapy:  ?aPTT goal 50-85 sec (target low end of goal) ?Monitor platelets by anticoagulation protocol: Yes ?  ?Plan:   ?aPTT is 90 sec at 16:11 ?Reduce bivalirudin dose by 10% (0.09 mg/kg/hr) = 12.8 ml/hr ?aPTT in 4h ?Daily CBC, aPTT q12h ?Monitor for s/sx of bleeding ? ?Thank you for involving pharmacy in this patient's care. ? ?Chevon Laufer BS, PharmD, BCPS ?Clinical Pharmacist ?01/16/2022 5:01 PM ? ?Contact: [phone] after 3 PM ? ?"Be curious, not judgmental..." -Jamal Maes ? ?**Pharmacist phone directory can be found on [email or web address] listed under [place]** ? ?

## 2022-01-16 NOTE — Progress Notes (Signed)
Pharmacy Antibiotic Note ? ?Michael Kemp is a 61 y.o. male admitted on 01/15/2022 with sepsis.  Pharmacy has been consulted for vancomycin and Unasyn dosing for aspiration pneumonia. He is afebrile, WBC up to 23.9, cultures neg, and renal function stable. CXR shows bilateral pneumonias.  ? ?Plan: ?Unasyn 3 g IV q6h ?Vancomycin '1500mg'$  IV q24h to target AUC 400-550 ?Check Vancomycin levels at steady state ?Monitor renal function and cx data  ? ?Height: '5\' 9"'$  (175.3 cm) ?Weight: 71.1 kg (156 lb 12 oz) ?IBW/kg (Calculated) : 70.7 ? ?Temp (24hrs), Avg:98.2 ?F (36.8 ?C), Min:97.5 ?F (36.4 ?C), Max:99.1 ?F (37.3 ?C) ? ?Recent Labs  ?Lab 01/06/2022 ?1739 01/30/2022 ?1837 01/06/2022 ?2037 01/28/2022 ?2325 01/14/22 ?0225 01/14/22 ?0240 01/15/22 ?0309 01/15/22 ?1147 01/16/22 ?9735  ?WBC 30.1*  --   --   --   --  21.0* 19.7* 22.1* 23.9*  ?CREATININE 1.11  --   --   --   --  0.78 0.98  --  0.99  ?LATICACIDVEN  --  3.0* 3.1* 2.8* 1.9  --   --   --   --   ? ?  ?Estimated Creatinine Clearance: 79.3 mL/min (by C-G formula based on SCr of 0.99 mg/dL).   ? ?Allergies  ?Allergen Reactions  ? Lisinopril Anaphylaxis  ? Amoxicillin Nausea And Vomiting  ? Aripiprazole Other (See Comments)  ?  Made patient feel "really low" per daughter at bedside  ? Olmesartan Other (See Comments)  ?  "bothered stomach"  ? Shellfish Allergy Nausea Only  ?  "Bad headache"  ? ? ?Antimicrobials this admission: ?Vancomycin 4/13 >>  ?Cefepime 4/13 >> 4/15 ?Flagyl 4/13 >> 4/15  ?Unasyn 4/15 >> ? ?Dose adjustments this admission: ? ?Microbiology results: ?4/12 BCx: ngtd ?4/13 UCx: neg ? ?Thank you for involving pharmacy in this patient's care. ? ?Renold Genta, PharmD, BCPS ?Clinical Pharmacist ?Clinical phone for 01/16/2022 until 3p is [phone] ?01/16/2022 10:22 AM ? ?**Pharmacist phone directory can be found on [email or web address] listed under [place]** ? ?

## 2022-01-16 NOTE — Progress Notes (Signed)
Occupational Therapy Evaluation ?Late Entry ? ?PTA, pt was living with his daughter and was performing BADLs; since recent hospital admission, he was requiring the use of w/c and RW. Pt currently requiring Max A for ADLs and bed mobility due to decreased balance, strength, and activity tolerance. Despite significant fatigue, pt motivated to participate in therapy. Pt presenting with pulsating nystagmus and reports dizziness (currently no diplopia). Pt would benefit from further acute OT to facilitate safe dc. Recommend dc to AIR for further OT to optimize safety, independence with ADLs, and return to PLOF.  ? ? 01/15/22 1702  ?OT Visit Information  ?Last OT Received On 01/16/22  ?Assistance Needed +2  ?PT/OT/SLP Co-Evaluation/Treatment Yes  ?Reason for Co-Treatment Complexity of the patient's impairments (multi-system involvement);To address functional/ADL transfers  ?OT goals addressed during session ADL's and self-care  ?History of Present Illness 61 y.o. male presents to [facility] hospital on 01/14/2022 as a transfer from [place]. Pt with sudden onset confusion, found to be septic with BLE DVT and bilateral PE with R heart strain. 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 PMH includes HTN and pancreatic adenocarcinoma with known liver mets along with concern for potential leptomeningeal involvement of brain.  ?Precautions  ?Precautions Fall  ?Precaution Comments spontaneous nystagmus  ?Restrictions  ?Weight Bearing Restrictions No  ?Home Living  ?Family/patient expects to be discharged to: Private residence  ?Living Arrangements Children ?(SIL, 4 grandkids, pets)  ?Available Help at Discharge Family;Available PRN/intermittently  ?Type of Home House  ?Home Access Level entry  ?Home Layout Multi-level ?(4 levels)  ?Alternate Level Stairs-Number of Steps Stair lift to bedroom upstairs  ?Bathroom Shower/Tub Tub/shower unit  ?Bathroom Toilet  Standard  ?Home Equipment Wheelchair - manual;BSC/3in1;Shower Land (2 wheels);Hospital bed;Hand held shower head  ?Prior Function  ?Prior Level of Function  Independent/Modified Independent  ?Mobility Comments Using his wheelchair mainly. Use of walker for short distances and transfers. Prior to last hospitalization, he did not require DME  ?ADLs Comments family helping with ADLs due to strength and fatigue. Up watch tv during the day. Prior to last hospitalization (dc on good friday), was performing all ADLs.  ?Communication  ?Communication Other (comment) ?(soft spoken)  ?Pain Assessment  ?Pain Assessment No/denies pain  ?Cognition  ?Arousal/Alertness Awake/alert  ?Behavior During Therapy Flat affect  ?Overall Cognitive Status Impaired/Different from baseline  ?Area of Impairment Attention;Memory;Following commands;Safety/judgement;Awareness;Problem solving  ?Current Attention Level Sustained;Focused  ?Memory Decreased short-term memory  ?Following Commands Follows one step commands with increased time;Follows one step commands inconsistently  ?Safety/Judgement Decreased awareness of safety;Decreased awareness of deficits  ?Awareness Intellectual  ?Problem Solving Slow processing;Requires verbal cues  ?Upper Extremity Assessment  ?Upper Extremity Assessment Generalized weakness  ?Lower Extremity Assessment  ?Lower Extremity Assessment Generalized weakness  ?Cervical / Trunk Assessment  ?Cervical / Trunk Assessment Kyphotic;Other exceptions  ?Cervical / Trunk Exceptions R lateral cervical flexion and rotation  ?Vision- History  ?Baseline Vision/History 1 Wears glasses  ?Vision- Assessment  ?Vision Assessment? Vision impaired- to be further tested in functional context  ?Additional Comments Supine: pulsating L nystagmus. Sitting: slightly rotating L and down nystagmus.  ?ADL  ?Overall ADL's  Needs assistance/impaired  ?Eating/Feeding NPO  ?Grooming Minimal assistance;Bed level  ?Upper Body Bathing  Maximal assistance;Sitting;Bed level  ?Lower Body Bathing Maximal assistance;Bed level  ?Upper Body Dressing  Maximal assistance;Sitting;Bed level  ?Lower Body Dressing Maximal assistance;Bed level  ?General ADL Comments Pt sitting at EOB with close  Min guard-Min A. Very fatigued  ?Bed Mobility  ?Overal bed mobility Needs Assistance  ?Bed Mobility Supine to Sit;Sit to Sidelying  ?Supine to sit Max assist;+2 for physical assistance  ?Sit to sidelying +2 for physical assistance;Max assist  ?Transfers  ?General transfer comment Defered due to fatigue  ?Balance  ?Overall balance assessment Needs assistance  ?Sitting-balance support Single extremity supported;Bilateral upper extremity supported;Feet supported  ?Sitting balance-Leahy Scale Poor  ?Sitting balance - Comments R lateral lean initially, corrects with visual and verbal cues  ?Postural control Right lateral lean  ?General Comments  ?General comments (skin integrity, edema, etc.) Daughter present. VSS, pt on 5L [place] sats stable in mid 90s, HR in low 100s  ?OT - End of Session  ?Activity Tolerance Patient limited by fatigue  ?Patient left in bed;with call bell/phone within reach;with family/visitor present  ?Nurse Communication Mobility status  ?OT Assessment  ?OT Recommendation/Assessment Patient needs continued OT Services  ?OT Visit Diagnosis Unsteadiness on feet (R26.81);Other abnormalities of gait and mobility (R26.89);Muscle weakness (generalized) (M62.81)  ?OT Problem List Decreased strength;Decreased range of motion;Decreased activity tolerance;Impaired balance (sitting and/or standing);Decreased knowledge of precautions;Decreased knowledge of use of DME or AE;Decreased cognition  ?OT Plan  ?OT Frequency (ACUTE ONLY) Min 2X/week  ?OT Treatment/Interventions (ACUTE ONLY) Self-care/ADL training;Therapeutic exercise;Energy conservation;DME and/or AE instruction;Therapeutic activities;Patient/family education  ?AM-PAC OT "6 Clicks" Daily Activity Outcome Measure  (Version 2)  ?Help from another person eating meals? 2  ?Help from another person taking care of personal grooming? 2  ?Help from another person toileting, which includes using toliet, bedpan, or urinal? 1  ?Help from another person bathing (including washing, rinsing, drying)? 2  ?Help from another person to put on and taking off regular upper body clothing? 2  ?Help from another person to put on and taking off regular lower body clothing? 1  ?6 Click Score 10  ?Progressive Mobility  ?What is the highest level of mobility based on the progressive mobility assessment? Level 1 (Bedfast) - Unable to balance while sitting on edge of bed  ?Activity Dangled on edge of bed  ?OT Recommendation  ?Follow Up Recommendations Acute inpatient rehab (3hours/day)  ?Assistance recommended at discharge Frequent or constant Supervision/Assistance  ?Patient can return home with the following A lot of help with walking and/or transfers;A lot of help with bathing/dressing/bathroom  ?Functional Status Assessent Patient has had a recent decline in their functional status and demonstrates the ability to make significant improvements in function in a reasonable and predictable amount of time.  ?OT Equipment BSC/3in1  ?Individuals Consulted  ?Consulted and Agree with Results and Recommendations Patient;Family member/caregiver  ?Family Member Consulted daughter  ?Acute Rehab OT Goals  ?Patient Stated Goal Go home  ?OT Goal Formulation With patient  ?Time For Goal Achievement 01/29/22  ?Potential to Achieve Goals Good  ?OT Time Calculation  ?OT Start Time (ACUTE ONLY) 1702  ?OT Stop Time (ACUTE ONLY) 1728  ?OT Time Calculation (min) 26 min  ?OT General Charges  ?$OT Visit 1 Visit  ?OT Evaluation  ?$OT Eval Moderate Complexity 1 Mod  ?Written Expression  ?Dominant Hand Right  ? ? ? ?Nilani Hugill MSOT, OTR/L ?Acute Rehab ?Pager: [phone] ?Office: [phone] ?

## 2022-01-16 NOTE — Progress Notes (Signed)
ANTICOAGULATION CONSULT NOTE ? ?Pharmacy Consult for bivalirudin ?Indication: pulmonary embolus, DVT, and stroke ? ?Allergies  ?Allergen Reactions  ? Lisinopril Anaphylaxis  ? Amoxicillin Nausea And Vomiting  ? Aripiprazole Other (See Comments)  ?  Made patient feel "really low" per daughter at bedside  ? Olmesartan Other (See Comments)  ?  "bothered stomach"  ? Shellfish Allergy Nausea Only  ?  "Bad headache"  ? ? ?Patient Measurements: ?Height: '5\' 9"'$  (175.3 cm) ?Weight: 71.1 kg (156 lb 12 oz) ?IBW/kg (Calculated) : 70.7 ? ?Vital Signs: ?Temp: 98.4 ?F (36.9 ?C) (04/15 0800) ?Temp Source: Axillary (04/15 0800) ?BP: 120/82 (04/15 1100) ?Pulse Rate: 130 (04/15 1100) ? ?Labs: ?Recent Labs  ?  01/14/22 ?0416 01/14/22 ?0223 01/14/22 ?1039 01/15/22 ?0309 01/15/22 ?1147 01/15/22 ?2126 01/16/22 ?3612 01/16/22 ?2449 01/16/22 ?1018  ?HGB  --  7.5*  --  7.3* 7.4*  --  7.7* 8.8*  --   ?HCT  --  22.6*  --  22.6* 22.9*  --  23.6* 26.0*  --   ?PLT  --  108*  --  135* 154  --  140*  --   --   ?LABPROT 24.9*  --   --   --   --   --   --   --   --   ?INR 2.3*  --   --   --   --   --   --   --   --   ?HEPARINUNFRC  --   --    < > <0.10* <0.10* 0.13* 0.18*  --  0.20*  ?CREATININE  --  0.78  --  0.98  --   --  0.99  --   --   ? < > = values in this interval not displayed.  ? ? ?Estimated Creatinine Clearance: 79.3 mL/min (by C-G formula based on SCr of 0.99 mg/dL). ? ?Assessment: ?61 yo M with metastatic pancreatic cancer admitted with sepsis and acute metabolic encephalopathy. Pt started on heparin for bilateral PE with right heart strain and acute bilateral DVTs. Pt also with large right cerebellar stroke. Neurology and primary team in agreement with anticoagulation due to large clot burden even with new large stroke. Request no bolus and low goal. ? ?Heparin level subtherapeutic at 0.2 on 2500 units/hr (35 units/kg). Spoke with Dr Erlinda Hong about changing agent to bivalirudin given subtherapeutic heparin levels and high rate - ok to change.  No bleeding noted, Hgb up to 8.8, platelets are low at 140.  ? ?Goal of Therapy:  ?aPTT goal 50-85 sec (target low end of goal) ?Monitor platelets by anticoagulation protocol: Yes ?  ?Plan:   ?Discontinue IV heparin - discussed with RN ?No bolus per protocol ?Begin bivalirudin 0.1 mg/kg/hr ?aPTT in 4h ?Daily CBC, aPTT q12h ?Monitor for s/sx of bleeding ? ?Thank you for involving pharmacy in this patient's care. ? ?Renold Genta, PharmD, BCPS ?Clinical Pharmacist ?Clinical phone for 01/16/2022 until 3p is [phone] ?01/16/2022 12:19 PM ? ?**Pharmacist phone directory can be found on [email or web address] listed under [place]** ? ?

## 2022-01-16 NOTE — Plan of Care (Signed)
?  Problem: Education: ?Goal: Knowledge of General Education information will improve ?Description: Including pain rating scale, medication(s)/side effects and non-pharmacologic comfort measures ?Outcome: Progressing ?  ?Problem: Health Behavior/Discharge Planning: ?Goal: Ability to manage health-related needs will improve ?Outcome: Progressing ?  ?Problem: Clinical Measurements: ?Goal: Ability to maintain clinical measurements within normal limits will improve ?Outcome: Progressing ?Goal: Will remain free from infection ?Outcome: Progressing ?Goal: Diagnostic test results will improve ?Outcome: Progressing ?Goal: Respiratory complications will improve ?Outcome: Progressing ?Goal: Cardiovascular complication will be avoided ?Outcome: Progressing ?  ?Problem: Activity: ?Goal: Risk for activity intolerance will decrease ?Outcome: Progressing ?  ?Problem: Nutrition: ?Goal: Adequate nutrition will be maintained ?Outcome: Progressing ?  ?Problem: Coping: ?Goal: Level of anxiety will decrease ?Outcome: Progressing ?  ?Problem: Elimination: ?Goal: Will not experience complications related to bowel motility ?Outcome: Progressing ?Goal: Will not experience complications related to urinary retention ?Outcome: Progressing ?  ?Problem: Pain Managment: ?Goal: General experience of comfort will improve ?Outcome: Progressing ?  ?Problem: Safety: ?Goal: Ability to remain free from injury will improve ?Outcome: Progressing ?  ?Problem: Skin Integrity: ?Goal: Risk for impaired skin integrity will decrease ?Outcome: Progressing ?  ?Problem: Education: ?Goal: Ability to describe self-care measures that may prevent or decrease complications (Diabetes Survival Skills Education) will improve ?Outcome: Progressing ?Goal: Individualized Educational Video(s) ?Outcome: Progressing ?  ?Problem: Coping: ?Goal: Ability to adjust to condition or change in health will improve ?Outcome: Progressing ?  ?Problem: Fluid Volume: ?Goal: Ability to  maintain a balanced intake and output will improve ?Outcome: Progressing ?  ?Problem: Health Behavior/Discharge Planning: ?Goal: Ability to identify and utilize available resources and services will improve ?Outcome: Progressing ?Goal: Ability to manage health-related needs will improve ?Outcome: Progressing ?  ?Problem: Metabolic: ?Goal: Ability to maintain appropriate glucose levels will improve ?Outcome: Progressing ?  ?Problem: Nutritional: ?Goal: Maintenance of adequate nutrition will improve ?Outcome: Progressing ?Goal: Progress toward achieving an optimal weight will improve ?Outcome: Progressing ?  ?Problem: Skin Integrity: ?Goal: Risk for impaired skin integrity will decrease ?Outcome: Progressing ?  ?Problem: Tissue Perfusion: ?Goal: Adequacy of tissue perfusion will improve ?Outcome: Progressing ?  ?Problem: Education: ?Goal: Knowledge of disease or condition will improve ?Outcome: Progressing ?Goal: Knowledge of secondary prevention will improve (SELECT ALL) ?Outcome: Progressing ?Goal: Knowledge of patient specific risk factors will improve (INDIVIDUALIZE FOR PATIENT) ?Outcome: Progressing ?Goal: Individualized Educational Video(s) ?Outcome: Progressing ?  ?Problem: Coping: ?Goal: Will verbalize positive feelings about self ?Outcome: Progressing ?Goal: Will identify appropriate support needs ?Outcome: Progressing ?  ?Problem: Health Behavior/Discharge Planning: ?Goal: Ability to manage health-related needs will improve ?Outcome: Progressing ?  ?Problem: Self-Care: ?Goal: Ability to participate in self-care as condition permits will improve ?Outcome: Progressing ?Goal: Verbalization of feelings and concerns over difficulty with self-care will improve ?Outcome: Progressing ?Goal: Ability to communicate needs accurately will improve ?Outcome: Progressing ?  ?Problem: Nutrition: ?Goal: Risk of aspiration will decrease ?Outcome: Progressing ?  ?Problem: Ischemic Stroke/TIA Tissue Perfusion: ?Goal:  Complications of ischemic stroke/TIA will be minimized ?Outcome: Progressing ?  ?

## 2022-01-16 NOTE — Progress Notes (Addendum)
ANTICOAGULATION CONSULT NOTE- follow-up ? ?Pharmacy Consult for bivalirudin ?Indication: pulmonary embolus, DVT, and stroke ? ?Allergies  ?Allergen Reactions  ? Lisinopril Anaphylaxis  ? Amoxicillin Nausea And Vomiting  ? Aripiprazole Other (See Comments)  ?  Made patient feel "really low" per daughter at bedside  ? Olmesartan Other (See Comments)  ?  "bothered stomach"  ? Shellfish Allergy Nausea Only  ?  "Bad headache"  ? ? ?Patient Measurements: ?Height: '5\' 9"'$  (175.3 cm) ?Weight: 71.1 kg (156 lb 12 oz) ?IBW/kg (Calculated) : 70.7 ? ?Vital Signs: ?Temp: 98.2 ?F (36.8 ?C) (04/15 1600) ?Temp Source: Axillary (04/15 1600) ?BP: 83/59 (04/15 1845) ?Pulse Rate: 129 (04/15 1845) ? ?Labs: ?Recent Labs  ?   ?0000 01/14/22 ?0416 01/14/22 ?4944 01/14/22 ?1039 01/15/22 ?0309 01/15/22 ?1147 01/15/22 ?2126 01/16/22 ?9675 01/16/22 ?9163 01/16/22 ?1018 01/16/22 ?1611 01/16/22 ?2044  ?HGB   < >  --  7.5*  --  7.3* 7.4*  --  7.7* 8.8*  --   --   --   ?HCT   < >  --  22.6*  --  22.6* 22.9*  --  23.6* 26.0*  --   --   --   ?PLT   < >  --  108*  --  135* 154  --  140*  --   --   --   --   ?APTT  --   --   --   --   --   --   --   --   --   --  90* 86*  ?LABPROT  --  24.9*  --   --   --   --   --   --   --   --   --   --   ?INR  --  2.3*  --   --   --   --   --   --   --   --   --   --   ?HEPARINUNFRC  --   --   --    < > <0.10* <0.10* 0.13* 0.18*  --  0.20*  --   --   ?CREATININE  --   --  0.78  --  0.98  --   --  0.99  --   --   --   --   ? < > = values in this interval not displayed.  ? ? ?Estimated Creatinine Clearance: 79.3 mL/min (by C-G formula based on SCr of 0.99 mg/dL). ? ?Assessment: ?61 yo M with metastatic pancreatic cancer admitted with sepsis and acute metabolic encephalopathy. Pt started on heparin for bilateral PE with right heart strain and acute bilateral DVTs. Pt also with large right cerebellar stroke. Neurology and primary team in agreement with anticoagulation due to large clot burden even with new large stroke.  Request no bolus and low goal. ? ?Heparin level subtherapeutic at 0.2 on 2500 units/hr (35 units/kg). Spoke with Dr Erlinda Hong about changing agent to bivalirudin given subtherapeutic heparin levels and high rate - ok to change. No bleeding noted, Hgb up to 8.8, platelets are low at 140.  ? ?Goal of Therapy:  ?aPTT goal 50-85 sec (target low end of goal) ?Monitor platelets by anticoagulation protocol: Yes ?  ?Plan:   ?aPTT is 86 sec at 20:44 ?Decrease bivalirudin to 0.075 mg/kg/hr = 10.7 ml/hr (15% reduction) ?aPTT in 4h ?Daily CBC, aPTT q12h ?Monitor for s/sx of bleeding ? ?Thank you for involving pharmacy in this patient's care. ? ?  Alyce Inscore BS, PharmD, BCPS ?Clinical Pharmacist ?01/16/2022 9:39 PM ? ?Contact: [phone] after 3 PM ? ?"Be curious, not judgmental..." -Jamal Maes ? ?**Pharmacist phone directory can be found on [email or web address] listed under [place]** ? ?

## 2022-01-17 ENCOUNTER — Inpatient Hospital Stay (HOSPITAL_COMMUNITY): Payer: Medicaid Other

## 2022-01-17 DIAGNOSIS — J9601 Acute respiratory failure with hypoxia: Secondary | ICD-10-CM | POA: Diagnosis not present

## 2022-01-17 DIAGNOSIS — A4101 Sepsis due to Methicillin susceptible Staphylococcus aureus: Secondary | ICD-10-CM | POA: Diagnosis not present

## 2022-01-17 DIAGNOSIS — I82413 Acute embolism and thrombosis of femoral vein, bilateral: Secondary | ICD-10-CM | POA: Diagnosis not present

## 2022-01-17 DIAGNOSIS — A419 Sepsis, unspecified organism: Secondary | ICD-10-CM | POA: Diagnosis not present

## 2022-01-17 DIAGNOSIS — I639 Cerebral infarction, unspecified: Secondary | ICD-10-CM | POA: Diagnosis not present

## 2022-01-17 LAB — BASIC METABOLIC PANEL
Anion gap: 10 (ref 5–15)
BUN: 50 mg/dL — ABNORMAL HIGH (ref 6–20)
CO2: 18 mmol/L — ABNORMAL LOW (ref 22–32)
Calcium: 7.7 mg/dL — ABNORMAL LOW (ref 8.9–10.3)
Chloride: 126 mmol/L — ABNORMAL HIGH (ref 98–111)
Creatinine, Ser: 1.64 mg/dL — ABNORMAL HIGH (ref 0.61–1.24)
GFR, Estimated: 48 mL/min — ABNORMAL LOW (ref >60)
Glucose, Bld: 115 mg/dL — ABNORMAL HIGH (ref 70–99)
Potassium: 5.4 mmol/L — ABNORMAL HIGH (ref 3.5–5.1)
Sodium: 154 mmol/L — ABNORMAL HIGH (ref 135–145)

## 2022-01-17 LAB — POCT I-STAT 7, (LYTES, BLD GAS, ICA,H+H)
Acid-base deficit: 7 mmol/L — ABNORMAL HIGH (ref 0.0–2.0)
Bicarbonate: 19.8 mmol/L — ABNORMAL LOW (ref 20.0–28.0)
Calcium, Ion: 1.26 mmol/L (ref 1.15–1.40)
HCT: 24 % — ABNORMAL LOW (ref 39.0–52.0)
Hemoglobin: 8.2 g/dL — ABNORMAL LOW (ref 13.0–17.0)
O2 Saturation: 100 %
Patient temperature: 99.1
Potassium: 4.4 mmol/L (ref 3.5–5.1)
Sodium: 155 mmol/L — ABNORMAL HIGH (ref 135–145)
TCO2: 21 mmol/L — ABNORMAL LOW (ref 22–32)
pCO2 arterial: 42.8 mmHg (ref 32–48)
pH, Arterial: 7.274 — ABNORMAL LOW (ref 7.35–7.45)
pO2, Arterial: 238 mmHg — ABNORMAL HIGH (ref 83–108)

## 2022-01-17 LAB — CBC
HCT: 25.4 % — ABNORMAL LOW (ref 39.0–52.0)
Hemoglobin: 8.3 g/dL — ABNORMAL LOW (ref 13.0–17.0)
MCH: 28.5 pg (ref 26.0–34.0)
MCHC: 32.7 g/dL (ref 30.0–36.0)
MCV: 87.3 fL (ref 80.0–100.0)
Platelets: 135 10*3/uL — ABNORMAL LOW (ref 150–400)
RBC: 2.91 MIL/uL — ABNORMAL LOW (ref 4.22–5.81)
RDW: 17.6 % — ABNORMAL HIGH (ref 11.5–15.5)
WBC: 31 10*3/uL — ABNORMAL HIGH (ref 4.0–10.5)
nRBC: 2.9 % — ABNORMAL HIGH (ref 0.0–0.2)

## 2022-01-17 LAB — GLUCOSE, CAPILLARY
Glucose-Capillary: 114 mg/dL — ABNORMAL HIGH (ref 70–99)
Glucose-Capillary: 114 mg/dL — ABNORMAL HIGH (ref 70–99)
Glucose-Capillary: 123 mg/dL — ABNORMAL HIGH (ref 70–99)
Glucose-Capillary: 134 mg/dL — ABNORMAL HIGH (ref 70–99)
Glucose-Capillary: 152 mg/dL — ABNORMAL HIGH (ref 70–99)
Glucose-Capillary: 153 mg/dL — ABNORMAL HIGH (ref 70–99)
Glucose-Capillary: 154 mg/dL — ABNORMAL HIGH (ref 70–99)

## 2022-01-17 LAB — SODIUM
Sodium: 156 mmol/L — ABNORMAL HIGH (ref 135–145)
Sodium: 156 mmol/L — ABNORMAL HIGH (ref 135–145)
Sodium: 154 mmol/L — ABNORMAL HIGH (ref 135–145)
Sodium: 154 mmol/L — ABNORMAL HIGH (ref 135–145)

## 2022-01-17 LAB — APTT
aPTT: 89 seconds — ABNORMAL HIGH (ref 24–36)
aPTT: 82 seconds — ABNORMAL HIGH (ref 24–36)
aPTT: 80 seconds — ABNORMAL HIGH (ref 24–36)
aPTT: 56 seconds — ABNORMAL HIGH (ref 24–36)

## 2022-01-17 MED ORDER — LACTATED RINGERS IV BOLUS
1000.0000 mL | Freq: Once | INTRAVENOUS | Status: AC
  Administered 2022-01-17: 1000 mL via INTRAVENOUS

## 2022-01-17 MED ORDER — ORAL CARE MOUTH RINSE
15.0000 mL | OROMUCOSAL | Status: DC
  Administered 2022-01-17 – 2022-01-20 (×28): 15 mL via OROMUCOSAL

## 2022-01-17 MED ORDER — FENTANYL CITRATE PF 50 MCG/ML IJ SOSY
50.0000 ug | PREFILLED_SYRINGE | INTRAMUSCULAR | Status: DC | PRN
  Filled 2022-01-17: qty 1

## 2022-01-17 MED ORDER — CHLORHEXIDINE GLUCONATE 0.12% ORAL RINSE (MEDLINE KIT)
15.0000 mL | Freq: Two times a day (BID) | OROMUCOSAL | Status: DC
  Administered 2022-01-17 – 2022-01-19 (×6): 15 mL via OROMUCOSAL

## 2022-01-17 MED ORDER — SODIUM CHLORIDE 0.9 % IV SOLN
0.0500 mg/kg/h | INTRAVENOUS | Status: DC
  Administered 2022-01-17: 0.05 mg/kg/h via INTRAVENOUS
  Filled 2022-01-17: qty 250

## 2022-01-17 MED ORDER — DOCUSATE SODIUM 50 MG/5ML PO LIQD
100.0000 mg | Freq: Two times a day (BID) | ORAL | Status: DC
  Administered 2022-01-17 – 2022-01-19 (×6): 100 mg
  Filled 2022-01-17 (×6): qty 10

## 2022-01-17 MED ORDER — FENTANYL CITRATE PF 50 MCG/ML IJ SOSY
50.0000 ug | PREFILLED_SYRINGE | INTRAMUSCULAR | Status: DC | PRN
  Administered 2022-01-18 – 2022-01-19 (×2): 50 ug via INTRAVENOUS
  Filled 2022-01-17: qty 1

## 2022-01-17 MED ORDER — ROCURONIUM BROMIDE 10 MG/ML (PF) SYRINGE
PREFILLED_SYRINGE | INTRAVENOUS | Status: AC
  Administered 2022-01-17: 100 mg
  Filled 2022-01-17: qty 10

## 2022-01-17 MED ORDER — LACTATED RINGERS IV BOLUS
1000.0000 mL | Freq: Once | INTRAVENOUS | Status: AC
Start: 2022-01-17 — End: 2022-01-17
  Administered 2022-01-17: 1000 mL via INTRAVENOUS

## 2022-01-17 MED ORDER — POLYETHYLENE GLYCOL 3350 17 G PO PACK
17.0000 g | PACK | Freq: Every day | ORAL | Status: DC
  Administered 2022-01-17 – 2022-01-19 (×3): 17 g
  Filled 2022-01-17 (×3): qty 1

## 2022-01-17 MED ORDER — SODIUM CHLORIDE 0.9 % IV SOLN
0.0375 mg/kg/h | INTRAVENOUS | Status: DC
  Administered 2022-01-17: 0.0375 mg/kg/h via INTRAVENOUS
  Filled 2022-01-17: qty 250

## 2022-01-17 MED ORDER — DEXMEDETOMIDINE HCL IN NACL 400 MCG/100ML IV SOLN
0.0000 ug/kg/h | INTRAVENOUS | Status: DC
  Administered 2022-01-17 – 2022-01-18 (×3): 0.4 ug/kg/h via INTRAVENOUS
  Administered 2022-01-18 – 2022-01-19 (×2): 0.5 ug/kg/h via INTRAVENOUS
  Administered 2022-01-19: 0.7 ug/kg/h via INTRAVENOUS
  Administered 2022-01-19: 0.4 ug/kg/h via INTRAVENOUS
  Filled 2022-01-17 (×6): qty 100

## 2022-01-17 MED ORDER — ACETAMINOPHEN 325 MG PO TABS
650.0000 mg | ORAL_TABLET | Freq: Four times a day (QID) | ORAL | Status: DC | PRN
  Administered 2022-01-17 – 2022-01-19 (×6): 650 mg
  Filled 2022-01-17 (×7): qty 2

## 2022-01-17 MED ORDER — ETOMIDATE 2 MG/ML IV SOLN
INTRAVENOUS | Status: AC
Start: 2022-01-17 — End: 2022-01-17
  Administered 2022-01-17: 20 mg
  Filled 2022-01-17: qty 20

## 2022-01-17 MED ORDER — PANTOPRAZOLE SODIUM 40 MG IV SOLR
40.0000 mg | INTRAVENOUS | Status: DC
  Administered 2022-01-17 – 2022-01-19 (×3): 40 mg via INTRAVENOUS
  Filled 2022-01-17 (×3): qty 10

## 2022-01-17 NOTE — Progress Notes (Signed)
? ?NAME:  Michael Kemp, MRN:  [id number], DOB:  1961/04/01, LOS: 4 ?ADMISSION DATE:  01/19/2022, CONSULTATION DATE:  01/14/2022 ?REFERRING MD:  Dr. Benny Lennert, CHIEF COMPLAINT:  Code stroke   ? ?History of Present Illness:  ?61 y/o M with a PMH significant for pancreatic adenocarcinoma with mets to the liver and recent leptomeningeal spead on MRI brain/spine at [facility], diabetes, HTN, GERD, and failure to thrive who presented to the [facility] ED for sudden onset confusion.  ? ?On ED arrival patient was seen with stable vital signs but new underlying confusion persisted. Labwork significant for leukocytosis, anemia, AKI, and elevated liver enzymes.  CT angio chest/abdomen/pelvis obtained on arrival and revealed bilateral pulmonary emboli with CT evidence of right heart strain as well as known distal pancreatic tail mass with mild ascites and splenic venous occlusion of chronic nature.  Head CT on admit negative ? 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 ?Given multiple medical conditions with new acute strokes neurology and critical care was consulted to assist in further care. Patient will be transferred to [facility] for further neurologic care.  ? ?Pertinent  Medical History  ?Pancreatic adenocarcinoma with mets to the liver and recent leptomeningeal spear on MRI brain/spine at [facility], diabetes, HTN, GERD, and failure to thrive ? ?Significant Hospital Events: ?Including procedures, antibiotic start and stop dates in addition to other pertinent events   ?4/12 admitted with new onset confusion found to have bilateral PE with CT evidence of right heart strain  ?4/13 MRI brain was completed and revealed acute to early subacute right PICA infarct with mild edema and partial effacement of fourth  ventricle, scattered additional smaller infarcts, potentially occluded proximal right V4 segment flow, and known leptomeningeal disease. PCCM consulted ?4/15 CTH shows stable R PICA infarct, no hemorrhagic transformation, worsening hypoxic failure and R-sided PNA, likely aspirating, change to Unasyn/Vanc ? ?Interim History / Subjective:  ?Patient was noted to be lethargic, with weak cough and gag ?He had pooled secretions in the back of the throat, unable to clear, aspirating ?Remained hypotensive and tachycardic ? ?Objective   ?Blood pressure 97/69, pulse (!) 121, temperature 99.1 ?F (37.3 ?C), temperature source Axillary, resp. rate (!) 24, height '5\' 9"'$  (1.753 m), weight 81.8 kg, SpO2 100 %. ?   ?Vent Mode: PRVC ?FiO2 (%):  [100 %] 100 % ?Set Rate:  [24 bmp] 24 bmp ?Vt Set:  [560 mL] 560 mL ?PEEP:  [5 cmH20] 5 cmH20 ?Plateau Pressure:  [21 cmH20] 21 cmH20  ? ?Intake/Output Summary (Last 24 hours) at 01/17/2022 1043 ?Last data filed at 01/17/2022 0800 ?Gross per 24 hour  ?Intake 1878.31 ml  ?Output 1050 ml  ?Net 828.31 ml  ? ?Filed Weights  ? 01/16/2022 1958 01/14/22 0100 01/17/22 0515  ?Weight: 66.2 kg 71.1 kg 81.8 kg  ? ? ?Physical exam: ?General: Crtitically ill-appearing male, lying on the bed ?HEENT: [place]/AT, eyes anicteric.  Severely dry mucous membranes ?Neuro: Lethargic, eyes open, not following commands, antigravity on right side, withdrawing in other extremities ?Chest: Bilateral basal crackles right more than left, no wheezes or rhonchi ?Heart: Regular rate and rhythm, no murmurs or gallops ?Abdomen: Soft, nontender, nondistended, bowel sounds present ?Skin: No rash ? ?Resolved Hospital Problem list   ? ? ?Assessment & Plan:  ?Acute Hypoxic Respiratory Failure ?Mixed respiratory and  metabolic acidosis ?Severe sepsis with septic shock due to aspiration pneumonia, POA ?Patient has been aspirating, x-ray chest is suggestive of bilateral infiltrates right more than left ?He is pooling secretions in the back of the  throat with worsening mental status in the setting of acute stroke ?Follow-up respiratory culture ?Started on IV Unasyn and vancomycin ?Patient started requiring IV vasopressors with map goal 22 ?Continue monitor IV fluid ?Trend lactate ?We will proceed with endotracheal intubation and mechanical ventilation ? ?Acute bilateral PE with CT evidence of right heart strain  ?Bilateral acute lower extremity DVT  ?Continue heparin infusion ?High risk for intracranial bleeding given new strokes, however CTH without hemorrhagic transformation 4/15 ?Continue neuro watch ?If he improves might need IVC filter placement ? 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 ?Stage IV Metastatic pancreatic cancer with mets to the liver and brain  ?Followed at [facility], was receiving palliative Folfirinox at [facility]  ?Continue supportive care ?Avoid hepatotoxin ? ?Severe protein calorie malnutrition  ?Dysphagia ?Continue dietary supplements with tube feeds ?Failed speech and swallow evaluation ? ?Stage II sacral pressure injury, POA ?Continue wound care ? ?Anemia and thrombocytopenia ?H&H remained stable, closely monitor CBC ? ?Type 2 diabetes  ?Continue sliding scale insulin with CBG goal 140-180 ? ?Acute kidney injury/hyperkalemia ?Monitor IV fluid ?Closely monitor electrolytes and serum creatinine ?Avoid nephrotoxic agents ? ?Best Practice (right click and "Reselect all SmartList Selections" daily)  ?Diet/type: TF, NPO ?DVT prophylaxis: systemic  heparin ?GI prophylaxis: PPI ?Lines: N/A ?Foley:  N/A ?Code Status:  full code ?Last date of multidisciplinary goals of care discussion: Per neurology and palliative care family wishes to proceed with full scope of care and to remain full code.  Last meeting 4/14 with Dr. Rowe Pavy.    4/15 Per daughter and patient, continue full code ? ?Critical care time:  41 minutes   ? ? ?Performed by: Jacky Kindle ?  ?Critical care time was exclusive of separately billable procedures and treating other patients. ?  ?Critical care was necessary to treat or prevent imminent or life-threatening deterioration. ?  ?Critical care was time spent personally by me on the following activities: development of treatment plan with patient and/or surrogate as well as nursing, discussions with consultants, evaluation of patient's response to treatment, examination of patient, obtaining history from patient or surrogate, ordering and performing treatments and interventions, ordering and review of laboratory studies, ordering and review of radiographic studies, pulse oximetry and re-evaluation of patient's condition. ?  ?Jacky Kindle MD ?[facility] ?See Amion for pager ?If no response to pager, please call [phone] until 7pm ?After 7pm, Please call E-link [phone]  ? ? ? ?

## 2022-01-17 NOTE — Progress Notes (Signed)
ANTICOAGULATION CONSULT NOTE- follow-up ? ?Pharmacy Consult for bivalirudin ?Indication: pulmonary embolus, DVT, and stroke ? ?Allergies  ?Allergen Reactions  ? Lisinopril Anaphylaxis  ? Amoxicillin Nausea And Vomiting  ? Aripiprazole Other (See Comments)  ?  Made patient feel "really low" per daughter at bedside  ? Olmesartan Other (See Comments)  ?  "bothered stomach"  ? Shellfish Allergy Nausea Only  ?  "Bad headache"  ? ? ?Patient Measurements: ?Height: '5\' 9"'$  (175.3 cm) ?Weight: 81.8 kg (180 lb 5.4 oz) ?IBW/kg (Calculated) : 70.7 ? ?Vital Signs: ?Temp: 99.1 ?F (37.3 ?C) (04/16 0800) ?Temp Source: Axillary (04/16 0800) ?BP: 97/69 (04/16 1043) ?Pulse Rate: 122 (04/16 1043) ? ?Labs: ?Recent Labs  ?  01/15/22 ?0309 01/15/22 ?1147 01/15/22 ?2126 01/16/22 ?0312 01/16/22 ?5852 01/16/22 ?1018 01/16/22 ?1611 01/16/22 ?2044 01/16/22 ?2238 01/17/22 ?0301 01/17/22 ?0454 01/17/22 ?7782 01/17/22 ?1039  ?HGB 7.3* 7.4*  --  7.7*   < >  --   --   --  8.5* 8.3*  --   --  8.2*  ?HCT 22.6* 22.9*  --  23.6*   < >  --   --   --  25.0* 25.4*  --   --  24.0*  ?PLT 135* 154  --  140*  --   --   --   --   --  135*  --   --   --   ?APTT  --   --   --   --   --   --    < > 86*  --  89*  --  82*  --   ?HEPARINUNFRC <0.10* <0.10* 0.13* 0.18*  --  0.20*  --   --   --   --   --   --   --   ?CREATININE 0.98  --   --  0.99  --   --   --   --   --   --  1.64*  --   --   ? < > = values in this interval not displayed.  ? ? ?Estimated Creatinine Clearance: 47.9 mL/min (A) (by C-G formula based on SCr of 1.64 mg/dL (H)). ? ?Assessment: ?61 yo M with metastatic pancreatic cancer admitted with sepsis and acute metabolic encephalopathy. Pt started on heparin for bilateral PE with right heart strain and acute bilateral DVTs. Pt also with large right cerebellar stroke. Neurology and primary team in agreement with anticoagulation due to large clot burden even with new large stroke. Request no bolus and low goal. ? ?Bivalirudin initiated after difficulty  obtaining therapeutic heparin levels. CBC remains low but stable. No s/sx of bleeding reported. ? ?aPTT within goal range at 82 sec but would like to target lower end of goal. No bleeding noted, Hgb low stable 8s, platelets are down to 135. AKI noted this morning, SCr up to 1.64 (baseline ~0.7-1). ? ?Goal of Therapy:  ?aPTT goal 50-85 sec (target low end of goal) ?Monitor platelets by anticoagulation protocol: Yes ?  ?Plan:   ?Decrease bivalirudin ~16% to 0.05 mg/kg/hr  ?aPTT in 4h ?Daily CBC, aPTT q12h ?Monitor for s/sx of bleeding ? ?Thank you for involving pharmacy in this patient's care. ? ?Renold Genta, PharmD, BCPS ?Clinical Pharmacist ?Clinical phone for 01/17/2022 until 3p is [phone] ?01/17/2022 11:05 AM ? ?**Pharmacist phone directory can be found on [email or web address] listed under [place]** ? ? ? ?

## 2022-01-17 NOTE — Progress Notes (Addendum)
STROKE TEAM PROGRESS NOTE  ? ?INTERVAL HISTORY ?Patient Kemp seen in his room with no family at the bedside.  He was intubated this morning.  He has been requiring norepi to maintain his MAP goal.   ? ?Vitals:  ? 01/17/22 1115 01/17/22 1130 01/17/22 1145 01/17/22 1200  ?BP: (!) 84/61 96/68 (!) 89/66 90/67  ?Pulse: (!) 108 (!) 106 (!) 104 (!) 108  ?Resp: (!) 30 (!) 30 (!) 30 (!) 24  ?Temp:    99.6 ?F (37.6 ?C)  ?TempSrc:    Axillary  ?SpO2: 97% 97% 95% 98%  ?Weight:      ?Height:      ? ?CBC:  ?Recent Labs  ?Lab 01/14/22 ?3354 01/15/22 ?0309 01/15/22 ?1147 01/16/22 ?5625 01/16/22 ?6389 01/17/22 ?0301 01/17/22 ?1039  ?WBC 21.0* 19.7*   < > 23.9*  --  31.0*  --   ?NEUTROABS 17.6* 17.7*  --   --   --   --   --   ?HGB 7.5* 7.3*   < > 7.7*   < > 8.3* 8.2*  ?HCT 22.6* 22.6*   < > 23.6*   < > 25.4* 24.0*  ?MCV 85.9 86.9   < > 88.4  --  87.3  --   ?PLT 108* 135*   < > 140*  --  135*  --   ? < > = values in this interval not displayed.  ? ? ?Basic Metabolic Panel:  ?Recent Labs  ?Lab 01/14/22 ?3734 01/14/22 ?1353 01/16/22 ?0312 01/16/22 ?2876 01/17/22 ?8115 01/17/22 ?7262 01/17/22 ?1039  ?NA 138   < > 155*   < > 154* 156* 155*  ?K 4.1   < > 3.5   < > 5.4*  --  4.4  ?CL 103   < > 129*  --  126*  --   --   ?CO2 27   < > 20*  --  18*  --   --   ?GLUCOSE 107*   < > 197*  --  115*  --   --   ?BUN 30*   < > 35*  --  50*  --   --   ?CREATININE 0.78   < > 0.99  --  1.64*  --   --   ?CALCIUM 7.6*   < > 7.5*  --  7.7*  --   --   ?MG 1.9  --   --   --   --   --   --   ?PHOS 4.7*  --   --   --   --   --   --   ? < > = values in this interval not displayed.  ? ? ?Lipid Panel:  ?Recent Labs  ?Lab 01/16/22 ?0312  ?CHOL 66  ?TRIG 55  ?HDL 14*  ?CHOLHDL 4.7  ?VLDL 11  ?[place] 41  ? ? ?HgbA1c:  ?Recent Labs  ?Lab 01/14/22 ?0416  ?HGBA1C 5.8*  ? ? ?Urine Drug Screen:  ?Recent Labs  ?Lab 01/25/2022 ?2325  ?LABOPIA NONE DETECTED  ?COCAINSCRNUR NONE DETECTED  ?LABBENZ NONE DETECTED  ?AMPHETMU NONE DETECTED  ?THCU NONE DETECTED  ?LABBARB NONE DETECTED   ? ?  ?Alcohol Level No results for input(s): ETH in the last 168 hours. ? ?IMAGING past 24 hours ?DG CHEST PORT 1 VIEW ? ?Result Date: 01/17/2022 ?CLINICAL DATA:  Evaluate consolidation and pleural effusion. EXAM: PORTABLE CHEST 1 VIEW COMPARISON:  01/16/2022 FINDINGS: ET tube tip Kemp above the carina. There Kemp a right  chest wall port a catheter with tip at the right atrium. Unchanged right pleural effusion. Airspace opacities throughout the right lung are again noted. Compared with the previous exam there Kemp been increase opacification to the right upper lobe. Left lung remains clear. IMPRESSION: 1. Persistent airspace opacities throughout the right lung with increase opacification to the right upper lobe. 2. Persistent right pleural effusion. Electronically Signed   By: Kerby Moors M.D.   On: 01/17/2022 09:26   ? ?PHYSICAL EXAM ?General:  Thin-cachectic appearing appearing, African-American Kemp ?Respiratory:  Respirations synchronous with ventilator ? ?NEURO: (after recent intubation with sedation)  Pupils 80m and sluggishly reactive, corneal reflexes present, oculocephalic reflex absent, no response to noxious stimuli ? ? ? ?ASSESSMENT/PLAN ?Mr. Michael Kemp with history of pancreatic cancer with metastases to the liver and brain and HTN presenting with acute onset vertigo, nausea and vomiting.  He presented to the ED and was found to have an acute large right cerebellar stroke.  He was also found to have acute bilateral PEs and DVTs along with sepsis from an unclear source.  He was started on IV heparin for the PEs and DVTs and was given 3% HTS to treat cerebellar edema caused by his stroke. Na Kemp within therapeutic range, HTS stopped.  Heparin was changed to bivalrudin due to inability to obtain therapeutic level.  Family wishes to continue aggressive measures.  Patient has been intubated today and Kemp now requiring norepinephrine to maintain his MAP goal.  ? ?Stroke:  right large PICA  infarct with cytotoxic edema and mass effect, likely secondary to hypercoagulability from metastatic pancreatic cancer  ?CT head evolving acute right PICA territory infarct in right cerebellar hemisphere with posterior fossa mass effect but no hydrocephalus ?CTA head & neck occlusion of right vertebral artery at C2 level, occluded right PICA, enlargement of right lobe of thyroid gland ?CT perfusion 544mregion of hypoperfused parenchyma in right cerebellar hemisphere ?MRI  acute to early subacute right PICA infarct with mild edema and partial effacement of fourth ventricle, no hydrocephalus, scattered additional infarcts in bilateral cerebral and cerebellar hemispheres ?CT repeat 4/15 stable right PICA infarct without hydrocephalus ?CT repeat 4/17 pending ?2D Echo EF 60-65%moderate LVH, normal atrial septum ?LDL 41. ?HgbA1c 5.8 ?VTE prophylaxis - fully anticoagulated with bivalrudin ?No antithrombotic prior to admission, now on bivalrudin given difficulty with heparin level ?Therapy recommendations:  pending ?Disposition:  pending ? ?Respiratory Failure ?Patient failed swallow study and has likely aspirated ?CXR shows bilateral pneumonia ?Respirations labored and tachypneic ?On NRB 4/15 ?intubated by CCM 4/16 ? ?History of Hypertension, now hypotensive ?Home meds:  none ?Unstable, requiring norepinephrine to maintain MAP ?MAP goal >65 ?Long-term BP goal normotensive ? ?Hyperlipidemia ?Home meds:  none ?LDL 41, goal < 70 ?Statin not indicated as LDL below goal ? ?Sepsis with uncertain source ?Follow cultures - so far neg ?Antibiotics per primary team ?MAP goal >65 ? ?Acute bilateral PE with bilateral lower extremity DVT ?Bilateral DVTs seen on lower extremity doppler ?Acute bilateral PE with evidence of right heart strain seen on CT chest ?anticoagulated with IV heparin -> bivalrudin now ?Management per primary team ? ?Pancreatic adenocarcinoma with mets to the liver and brain with possible leptomeningeal  disease ?[facility] ? ?Other Stroke Risk Factors ? ? ?Other Active Problems ?Enlargement of right lobe of thyroid gland ?Nonurgent thyroid ultrasound  ? ?Hospital day # 4 ? ?[place] MSN,  AGACNP-BC ?[facility] ?See Amion for schedule and pager information ?01/17/2022 12:55 PM ? ?ATTENDING NOTE: ?I reviewed above note and agree with the assessment and plan. Pt was seen and examined.  ? ?No family at bedside.  Patient intubated this a.m. and currently unresponsible post intubation. Pupils 101m and sluggishly reactive, corneal reflexes present, oculocephalic reflex absent, no response to noxious stimuli.  Not on anticoagulation with Angiomax IV infusion.  Still has hypotension, on high dose of Levophed.  CT yesterday showed stable cerebral infarct and no hydrocephalus.  We will repeat CT in a.m. ? ?For detailed assessment and plan, please refer to above as I have made changes wherever appropriate.  ? ?JRosalin Hawking MD PhD ?Stroke Neurology ?01/17/2022 ?2:03 PM ? ? ?This patient Kemp critically ill due to large cerebellar infarct, posterior fossa mass effect, bilateral extensive PE and DVT, respiratory failure with intubation, metastasis from pancreatic cancer and at significant risk of neurological worsening, death form brain herniation, hydrocephalus, heart failure, respiratory failure. This patient's care requires constant monitoring of vital signs, hemodynamics, respiratory and cardiac monitoring, review of multiple databases, neurological assessment, discussion with family, other specialists and medical decision making of high complexity. I spent 35 minutes of neurocritical care time in the care of this patient.  I discussed with Dr. CTacy LearnCCM. ? ? ? ? ?To contact Stroke Continuity provider, please refer to [email or web address] ?After hours, contact General Neurology  ?

## 2022-01-17 NOTE — Progress Notes (Signed)
ANTICOAGULATION CONSULT NOTE- follow-up ? ?Pharmacy Consult for bivalirudin ?Indication: pulmonary embolus, DVT, and stroke ? ?Allergies  ?Allergen Reactions  ? Lisinopril Anaphylaxis  ? Amoxicillin Nausea And Vomiting  ? Aripiprazole Other (See Comments)  ?  Made patient feel "really low" per daughter at bedside  ? Olmesartan Other (See Comments)  ?  "bothered stomach"  ? Shellfish Allergy Nausea Only  ?  "Bad headache"  ? ? ?Patient Measurements: ?Height: '5\' 9"'$  (175.3 cm) ?Weight: 81.8 kg (180 lb 5.4 oz) ?IBW/kg (Calculated) : 70.7 ? ?Vital Signs: ?Temp: 101.7 ?F (38.7 ?C) (04/16 2000) ?Temp Source: Axillary (04/16 2000) ?BP: 96/66 (04/16 2100) ?Pulse Rate: 109 (04/16 2100) ? ?Labs: ?Recent Labs  ?  01/15/22 ?0309 01/15/22 ?1147 01/15/22 ?2126 01/16/22 ?0312 01/16/22 ?7106 01/16/22 ?1018 01/16/22 ?1611 01/16/22 ?2238 01/17/22 ?0301 01/17/22 ?0454 01/17/22 ?2694 01/17/22 ?1039 01/17/22 ?1528 01/17/22 ?2037  ?HGB 7.3* 7.4*  --  7.7*   < >  --   --  8.5* 8.3*  --   --  8.2*  --   --   ?HCT 22.6* 22.9*  --  23.6*   < >  --   --  25.0* 25.4*  --   --  24.0*  --   --   ?PLT 135* 154  --  140*  --   --   --   --  135*  --   --   --   --   --   ?APTT  --   --   --   --   --   --    < >  --  89*  --  82*  --  80* 56*  ?HEPARINUNFRC <0.10* <0.10* 0.13* 0.18*  --  0.20*  --   --   --   --   --   --   --   --   ?CREATININE 0.98  --   --  0.99  --   --   --   --   --  1.64*  --   --   --   --   ? < > = values in this interval not displayed.  ? ? ?Estimated Creatinine Clearance: 47.9 mL/min (A) (by C-G formula based on SCr of 1.64 mg/dL (H)). ? ?Assessment: ?61 yo M with metastatic pancreatic cancer admitted with sepsis and acute metabolic encephalopathy. Pt started on heparin for bilateral PE with right heart strain and acute bilateral DVTs. Pt also with large right cerebellar stroke. Neurology and primary team in agreement with anticoagulation due to large clot burden even with new large stroke. Request no bolus and low  goal. ? ?Bivalirudin initiated after difficulty obtaining therapeutic heparin levels. CBC remains low but stable. No s/sx of bleeding reported. ? ?aPTT within goal range at 80 sec but would like to target lower end of goal. No bleeding noted, Hgb low stable 8s, platelets are down to 135. AKI noted this morning, SCr up to 1.64 (baseline ~0.7-1). ? ?aPTT 56 seconds 4/16 at 2037 ? ?Goal of Therapy:  ?aPTT goal 50-85 sec (target low end of goal) ?Monitor platelets by anticoagulation protocol: Yes ?  ?Plan:   ?Continue bivalirudin at 0.0375 mg/kg/hr  ?aPTT in 4h to confirm continued therapeutic then q12h ?Daily CBC, aPTT q12h ?Monitor for s/sx of bleeding ? ?Thank you for involving pharmacy in this patient's care. ? ?Zaia Carre BS, PharmD, BCPS ?Clinical Pharmacist ?01/17/2022 9:19 PM ? ?Contact: [phone] after 3 PM ? ?"Be curious, not judgmental..." -  Jamal Maes ? ?**Pharmacist phone directory can be found on [email or web address] listed under [place]** ? ? ? ?

## 2022-01-17 NOTE — Procedures (Signed)
Intubation Procedure Note ? ?Michael Kemp  ?[id number]  ?1960-12-09 ? ?Date:01/17/22  ?Time:8:09 AM  ? ?Provider Performing:Emilyann Banka  ? ? ?Procedure: Intubation (92119) ? ?Indication(s) ?Respiratory Failure ? ?Consent ?Risks of the procedure as well as the alternatives and risks of each were explained to the patient and/or caregiver.  Consent for the procedure was obtained and is signed in the bedside chart ? ? ?Anesthesia ?Etomidate and Rocuronium ? ? ?Time Out ?Verified patient identification, verified procedure, site/side was marked, verified correct patient position, special equipment/implants available, medications/allergies/relevant history reviewed, required imaging and test results available. ? ? ?Sterile Technique ?Usual hand hygeine, masks, and gloves were used ? ? ?Procedure Description ?Patient positioned in bed supine.  Sedation given as noted above.  Patient was intubated with endotracheal tube using  DL .  View was Grade 1 full glottis .  Number of attempts was 1.  Colorimetric CO2 detector was consistent with tracheal placement. ? ? ?Complications/Tolerance ?None; patient tolerated the procedure well. ?Chest X-ray is ordered to verify placement. ? ? ?EBL ?Minimal ? ? ?Specimen(s) ?None ? ?

## 2022-01-17 NOTE — Progress Notes (Signed)
ANTICOAGULATION CONSULT NOTE- follow-up ? ?Pharmacy Consult for bivalirudin ?Indication: pulmonary embolus, DVT, and stroke ? ?Allergies  ?Allergen Reactions  ? Lisinopril Anaphylaxis  ? Amoxicillin Nausea And Vomiting  ? Aripiprazole Other (See Comments)  ?  Made patient feel "really low" per daughter at bedside  ? Olmesartan Other (See Comments)  ?  "bothered stomach"  ? Shellfish Allergy Nausea Only  ?  "Bad headache"  ? ? ?Patient Measurements: ?Height: '5\' 9"'$  (175.3 cm) ?Weight: 81.8 kg (180 lb 5.4 oz) ?IBW/kg (Calculated) : 70.7 ? ?Vital Signs: ?Temp: 99.6 ?F (37.6 ?C) (04/16 1200) ?Temp Source: Axillary (04/16 1200) ?BP: 92/69 (04/16 1545) ?Pulse Rate: 124 (04/16 1545) ? ?Labs: ?Recent Labs  ?  01/15/22 ?0309 01/15/22 ?1147 01/15/22 ?2126 01/16/22 ?0312 01/16/22 ?1324 01/16/22 ?1018 01/16/22 ?1611 01/16/22 ?2238 01/17/22 ?0301 01/17/22 ?0454 01/17/22 ?4010 01/17/22 ?1039 01/17/22 ?1528  ?HGB 7.3* 7.4*  --  7.7*   < >  --   --  8.5* 8.3*  --   --  8.2*  --   ?HCT 22.6* 22.9*  --  23.6*   < >  --   --  25.0* 25.4*  --   --  24.0*  --   ?PLT 135* 154  --  140*  --   --   --   --  135*  --   --   --   --   ?APTT  --   --   --   --   --   --    < >  --  89*  --  82*  --  80*  ?HEPARINUNFRC <0.10* <0.10* 0.13* 0.18*  --  0.20*  --   --   --   --   --   --   --   ?CREATININE 0.98  --   --  0.99  --   --   --   --   --  1.64*  --   --   --   ? < > = values in this interval not displayed.  ? ? ?Estimated Creatinine Clearance: 47.9 mL/min (A) (by C-G formula based on SCr of 1.64 mg/dL (H)). ? ?Assessment: ?61 yo M with metastatic pancreatic cancer admitted with sepsis and acute metabolic encephalopathy. Pt started on heparin for bilateral PE with right heart strain and acute bilateral DVTs. Pt also with large right cerebellar stroke. Neurology and primary team in agreement with anticoagulation due to large clot burden even with new large stroke. Request no bolus and low goal. ? ?Bivalirudin initiated after difficulty  obtaining therapeutic heparin levels. CBC remains low but stable. No s/sx of bleeding reported. ? ?aPTT within goal range at 80 sec but would like to target lower end of goal. No bleeding noted, Hgb low stable 8s, platelets are down to 135. AKI noted this morning, SCr up to 1.64 (baseline ~0.7-1). ? ?Goal of Therapy:  ?aPTT goal 50-85 sec (target low end of goal) ?Monitor platelets by anticoagulation protocol: Yes ?  ?Plan:   ?Decrease bivalirudin ~25% to 0.0375 mg/kg/hr  ?aPTT in 4h ?Daily CBC, aPTT q12h ?Monitor for s/sx of bleeding ? ?Thank you for involving pharmacy in this patient's care. ? ?Dock Baccam BS, PharmD, BCPS ?Clinical Pharmacist ?01/17/2022 4:07 PM ? ?Contact: [phone] after 3 PM ? ?"Be curious, not judgmental..." -Jamal Maes ? ?**Pharmacist phone directory can be found on [email or web address] listed under [place]** ? ? ? ?

## 2022-01-17 NOTE — Progress Notes (Signed)
ANTICOAGULATION CONSULT NOTE- follow-up ? ?Pharmacy Consult for bivalirudin ?Indication: pulmonary embolus, DVT, and stroke ? ?Allergies  ?Allergen Reactions  ? Lisinopril Anaphylaxis  ? Amoxicillin Nausea And Vomiting  ? Aripiprazole Other (See Comments)  ?  Made patient feel "really low" per daughter at bedside  ? Olmesartan Other (See Comments)  ?  "bothered stomach"  ? Shellfish Allergy Nausea Only  ?  "Bad headache"  ? ? ?Patient Measurements: ?Height: '5\' 9"'$  (175.3 cm) ?Weight: 71.1 kg (156 lb 12 oz) ?IBW/kg (Calculated) : 70.7 ? ?Vital Signs: ?Temp: 98.4 ?F (36.9 ?C) (04/16 0000) ?Temp Source: Axillary (04/15 1600) ?BP: 93/67 (04/16 0245) ?Pulse Rate: 117 (04/16 0245) ? ?Labs: ?Recent Labs  ?   ?0000 01/14/22 ?0416 01/14/22 ?0923 01/14/22 ?1039 01/15/22 ?0309 01/15/22 ?1147 01/15/22 ?2126 01/16/22 ?3007 01/16/22 ?6226 01/16/22 ?1018 01/16/22 ?1611 01/16/22 ?2044 01/16/22 ?2238 01/17/22 ?0301  ?HGB   < >  --  7.5*  --  7.3* 7.4*  --  7.7* 8.8*  --   --   --  8.5* 8.3*  ?HCT   < >  --  22.6*  --  22.6* 22.9*  --  23.6* 26.0*  --   --   --  25.0* 25.4*  ?PLT   < >  --  108*  --  135* 154  --  140*  --   --   --   --   --  135*  ?APTT  --   --   --   --   --   --   --   --   --   --  90* 86*  --  89*  ?LABPROT  --  24.9*  --   --   --   --   --   --   --   --   --   --   --   --   ?INR  --  2.3*  --   --   --   --   --   --   --   --   --   --   --   --   ?HEPARINUNFRC  --   --   --    < > <0.10* <0.10* 0.13* 0.18*  --  0.20*  --   --   --   --   ?CREATININE  --   --  0.78  --  0.98  --   --  0.99  --   --   --   --   --   --   ? < > = values in this interval not displayed.  ? ? ?Estimated Creatinine Clearance: 79.3 mL/min (by C-G formula based on SCr of 0.99 mg/dL). ? ?Assessment: ?61 yo M with metastatic pancreatic cancer admitted with sepsis and acute metabolic encephalopathy. Pt started on heparin for bilateral PE with right heart strain and acute bilateral DVTs. Pt also with large right cerebellar stroke.  Neurology and primary team in agreement with anticoagulation due to large clot burden even with new large stroke. Request no bolus and low goal. ? ?Bivalirudin initiated after difficulty obtaining therapeutic heparin levels. CBC remains low but stable. No s/sx of bleeding reported ? ?aPTT above goal: 89 sec ? ?Goal of Therapy:  ?aPTT goal 50-85 sec (target low end of goal) ?Monitor platelets by anticoagulation protocol: Yes ?  ?Plan:   ?Decrease bivalirudin ~20% to 0.06 mg/kg/hr  ?aPTT in 4h ?Daily CBC, aPTT q12h ?Monitor for s/sx  of bleeding ? ?Georga Bora, PharmD ?Clinical Pharmacist ?01/17/2022 3:42 AM ?Please check AMION for all [place] numbers ? ? ?

## 2022-01-18 ENCOUNTER — Inpatient Hospital Stay (HOSPITAL_COMMUNITY): Payer: Medicaid Other

## 2022-01-18 DIAGNOSIS — I82413 Acute embolism and thrombosis of femoral vein, bilateral: Secondary | ICD-10-CM | POA: Diagnosis not present

## 2022-01-18 DIAGNOSIS — A419 Sepsis, unspecified organism: Principal | ICD-10-CM

## 2022-01-18 DIAGNOSIS — J9601 Acute respiratory failure with hypoxia: Secondary | ICD-10-CM | POA: Diagnosis not present

## 2022-01-18 DIAGNOSIS — I2699 Other pulmonary embolism without acute cor pulmonale: Secondary | ICD-10-CM | POA: Diagnosis not present

## 2022-01-18 DIAGNOSIS — E440 Moderate protein-calorie malnutrition: Secondary | ICD-10-CM

## 2022-01-18 LAB — GLUCOSE, CAPILLARY
Glucose-Capillary: 139 mg/dL — ABNORMAL HIGH (ref 70–99)
Glucose-Capillary: 155 mg/dL — ABNORMAL HIGH (ref 70–99)
Glucose-Capillary: 169 mg/dL — ABNORMAL HIGH (ref 70–99)
Glucose-Capillary: 175 mg/dL — ABNORMAL HIGH (ref 70–99)
Glucose-Capillary: 186 mg/dL — ABNORMAL HIGH (ref 70–99)
Glucose-Capillary: 189 mg/dL — ABNORMAL HIGH (ref 70–99)
Glucose-Capillary: 477 mg/dL — ABNORMAL HIGH (ref 70–99)
Glucose-Capillary: 55 mg/dL — ABNORMAL LOW (ref 70–99)
Glucose-Capillary: <10 mg/dL — CL (ref 70–99)

## 2022-01-18 LAB — POCT I-STAT 7, (LYTES, BLD GAS, ICA,H+H)
Acid-base deficit: 6 mmol/L — ABNORMAL HIGH (ref 0.0–2.0)
Bicarbonate: 17.5 mmol/L — ABNORMAL LOW (ref 20.0–28.0)
Calcium, Ion: 1.24 mmol/L (ref 1.15–1.40)
HCT: 26 % — ABNORMAL LOW (ref 39.0–52.0)
Hemoglobin: 8.8 g/dL — ABNORMAL LOW (ref 13.0–17.0)
O2 Saturation: 98 %
Patient temperature: 97.5
Potassium: 4.5 mmol/L (ref 3.5–5.1)
Sodium: 154 mmol/L — ABNORMAL HIGH (ref 135–145)
TCO2: 18 mmol/L — ABNORMAL LOW (ref 22–32)
pCO2 arterial: 26.8 mmHg — ABNORMAL LOW (ref 32–48)
pH, Arterial: 7.42 (ref 7.35–7.45)
pO2, Arterial: 92 mmHg (ref 83–108)

## 2022-01-18 LAB — CBC
HCT: 25.5 % — ABNORMAL LOW (ref 39.0–52.0)
Hemoglobin: 8.2 g/dL — ABNORMAL LOW (ref 13.0–17.0)
MCH: 28.1 pg (ref 26.0–34.0)
MCHC: 32.2 g/dL (ref 30.0–36.0)
MCV: 87.3 fL (ref 80.0–100.0)
Platelets: 100 10*3/uL — ABNORMAL LOW (ref 150–400)
RBC: 2.92 MIL/uL — ABNORMAL LOW (ref 4.22–5.81)
RDW: 18.7 % — ABNORMAL HIGH (ref 11.5–15.5)
WBC: 47.5 10*3/uL — ABNORMAL HIGH (ref 4.0–10.5)
nRBC: 4.7 % — ABNORMAL HIGH (ref 0.0–0.2)

## 2022-01-18 LAB — BASIC METABOLIC PANEL
Anion gap: 7 (ref 5–15)
BUN: 69 mg/dL — ABNORMAL HIGH (ref 6–20)
CO2: 19 mmol/L — ABNORMAL LOW (ref 22–32)
Calcium: 7.7 mg/dL — ABNORMAL LOW (ref 8.9–10.3)
Chloride: 126 mmol/L — ABNORMAL HIGH (ref 98–111)
Creatinine, Ser: 1.85 mg/dL — ABNORMAL HIGH (ref 0.61–1.24)
GFR, Estimated: 41 mL/min — ABNORMAL LOW (ref >60)
Glucose, Bld: 216 mg/dL — ABNORMAL HIGH (ref 70–99)
Potassium: 4.2 mmol/L (ref 3.5–5.1)
Sodium: 152 mmol/L — ABNORMAL HIGH (ref 135–145)

## 2022-01-18 LAB — CULTURE, BLOOD (SINGLE): Culture: NO GROWTH

## 2022-01-18 LAB — SODIUM
Sodium: 153 mmol/L — ABNORMAL HIGH (ref 135–145)
Sodium: 152 mmol/L — ABNORMAL HIGH (ref 135–145)
Sodium: 152 mmol/L — ABNORMAL HIGH (ref 135–145)
Sodium: 152 mmol/L — ABNORMAL HIGH (ref 135–145)

## 2022-01-18 LAB — APTT
aPTT: 101 seconds — ABNORMAL HIGH (ref 24–36)
aPTT: 54 s — ABNORMAL HIGH (ref 24–36)
aPTT: 39 seconds — ABNORMAL HIGH (ref 24–36)
aPTT: 52 seconds — ABNORMAL HIGH (ref 24–36)

## 2022-01-18 MED ORDER — SODIUM CHLORIDE 0.9 % IV SOLN: INTRAVENOUS | Status: DC | PRN

## 2022-01-18 MED ORDER — VASOPRESSIN 20 UNITS/100 ML INFUSION FOR SHOCK
0.0400 [IU]/min | INTRAVENOUS | Status: DC
  Administered 2022-01-18 – 2022-01-19 (×5): 0.04 [IU]/min via INTRAVENOUS
  Filled 2022-01-18 (×5): qty 100

## 2022-01-18 MED ORDER — HYDROCORTISONE SOD SUC (PF) 100 MG IJ SOLR
100.0000 mg | Freq: Two times a day (BID) | INTRAMUSCULAR | Status: DC
  Administered 2022-01-18 – 2022-01-20 (×4): 100 mg via INTRAVENOUS
  Filled 2022-01-18 (×4): qty 2

## 2022-01-18 MED ORDER — NOREPINEPHRINE 16 MG/250ML-% IV SOLN
0.0000 ug/min | INTRAVENOUS | Status: DC
  Administered 2022-01-18: 38 ug/min via INTRAVENOUS
  Administered 2022-01-18: 40 ug/min via INTRAVENOUS
  Administered 2022-01-19: 27 ug/min via INTRAVENOUS
  Administered 2022-01-19: 32 ug/min via INTRAVENOUS
  Filled 2022-01-18 (×4): qty 250

## 2022-01-18 MED ORDER — SODIUM CHLORIDE 0.9 % IV SOLN
3.0000 g | Freq: Three times a day (TID) | INTRAVENOUS | Status: DC
  Administered 2022-01-18 – 2022-01-19 (×5): 3 g via INTRAVENOUS
  Filled 2022-01-18 (×5): qty 8

## 2022-01-18 MED ORDER — SODIUM CHLORIDE 0.9 % IV SOLN
0.0240 mg/kg/h | INTRAVENOUS | Status: DC
  Administered 2022-01-18: 0.025 mg/kg/h via INTRAVENOUS
  Administered 2022-01-18: 0.03 mg/kg/h via INTRAVENOUS
  Filled 2022-01-18: qty 250

## 2022-01-18 NOTE — Progress Notes (Addendum)
ANTICOAGULATION CONSULT NOTE- follow-up ? ?Pharmacy Consult for bivalirudin ?Indication: pulmonary embolus, DVT, and stroke ? ?Allergies  ?Allergen Reactions  ? Lisinopril Anaphylaxis  ? Amoxicillin Nausea And Vomiting  ? Aripiprazole Other (See Comments)  ?  Made patient feel "really low" per daughter at bedside  ? Olmesartan Other (See Comments)  ?  "bothered stomach"  ? Shellfish Allergy Nausea Only  ?  "Bad headache"  ? ? ?Patient Measurements: ?Height: '5\' 9"'$  (175.3 cm) ?Weight: 81.8 kg (180 lb 5.4 oz) ?IBW/kg (Calculated) : 70.7 ? ?Vital Signs: ?Temp: 98.9 ?F (37.2 ?C) (04/17 0000) ?Temp Source: Axillary (04/17 0000) ?BP: 97/70 (04/17 0200) ?Pulse Rate: 92 (04/17 0000) ? ?Labs: ?Recent Labs  ?  01/15/22 ?0309 01/15/22 ?1147 01/15/22 ?2126 01/16/22 ?0312 01/16/22 ?4097 01/16/22 ?1018 01/16/22 ?1611 01/16/22 ?2238 01/17/22 ?0301 01/17/22 ?0454 01/17/22 ?3532 01/17/22 ?1039 01/17/22 ?1528 01/17/22 ?2037 01/18/22 ?9924  ?HGB 7.3* 7.4*  --  7.7*   < >  --   --  8.5* 8.3*  --   --  8.2*  --   --   --   ?HCT 22.6* 22.9*  --  23.6*   < >  --   --  25.0* 25.4*  --   --  24.0*  --   --   --   ?PLT 135* 154  --  140*  --   --   --   --  135*  --   --   --   --   --   --   ?APTT  --   --   --   --   --   --    < >  --  89*  --    < >  --  80* 56* 101*  ?HEPARINUNFRC <0.10* <0.10* 0.13* 0.18*  --  0.20*  --   --   --   --   --   --   --   --   --   ?CREATININE 0.98  --   --  0.99  --   --   --   --   --  1.64*  --   --   --   --   --   ? < > = values in this interval not displayed.  ? ? ?Estimated Creatinine Clearance: 47.9 mL/min (A) (by C-G formula based on SCr of 1.64 mg/dL (H)). ? ?Assessment: ?61 yo M with metastatic pancreatic cancer admitted with sepsis and acute metabolic encephalopathy. Pt started on heparin for bilateral PE with right heart strain and acute bilateral DVTs. Pt also with large right cerebellar stroke. Neurology and primary team in agreement with anticoagulation due to large clot burden even with new  large stroke. Request no bolus and low goal. ? ?Bivalirudin initiated after difficulty obtaining therapeutic heparin levels. CBC remains low but stable. No s/sx of bleeding reported. ? ?aPTT above goal: 101 seconds after being therapeutic previously; confirmed labs drawn from central port and IV infusing peripherally  ? ?Goal of Therapy:  ?aPTT goal 50-85 sec (target low end of goal) ?Monitor platelets by anticoagulation protocol: Yes ?  ?Plan:   ?Hold infusion for 30 minutes and Reduce bivalirudin (~30%) to 0.025 mg/kg/hr  ?aPTT in 4h to confirm continued therapeutic then q12h ?Daily CBC, aPTT q12h ?Monitor for s/sx of bleeding ? ?Georga Bora, PharmD ?Clinical Pharmacist ?01/18/2022 3:10 AM ?Please check AMION for all [place] numbers ? ? ? ? ?

## 2022-01-18 NOTE — Progress Notes (Signed)
PT Cancellation Note ? ?Patient Details ?Name: Michael Kemp ?MRN: [id number] ?DOB: [birth year] ? ? ?Cancelled Treatment:    Reason Eval/Treat Not Completed: Medical issues which prohibited therapy Pt intubated 4/16 and remains on the vent at this time.PT to follow up acutely as able.  ? ?Thermon Leyland, SPT ?Acute Rehab Services ? ? ? ?Thermon Leyland ?01/18/2022, 1:14 PM ?

## 2022-01-18 NOTE — Progress Notes (Signed)
SLP Cancellation Note ? ?Patient Details ?Name: Michael Kemp ?MRN: [id number] ?DOB: 03-Mar-1961 ? ? ?Cancelled treatment:       Reason Eval/Treat Not Completed: Medical issues which prohibited therapy (Pt intubated 4/16 and remains on the vent at this time. SLP will follow up on subsequent date.) ? ?Delberta Folts I. Hardin Negus, Ramos, CCC-SLP ?Acute Rehabilitation Services ?Office number [phone] ?Pager [phone] ? ?[name] ?01/18/2022, 8:13 AM ?

## 2022-01-18 NOTE — Progress Notes (Signed)
CCM paged for increasing suspicious discharge from established PEG, advised to continue to monitor, no new orders at this time.  ? ?

## 2022-01-18 NOTE — Progress Notes (Signed)
ANTICOAGULATION CONSULT NOTE- follow-up ? ?Pharmacy Consult for bivalirudin ?Indication: pulmonary embolus, DVT, and stroke ? ?Allergies  ?Allergen Reactions  ? Lisinopril Anaphylaxis  ? Amoxicillin Nausea And Vomiting  ? Aripiprazole Other (See Comments)  ?  Made patient feel "really low" per daughter at bedside  ? Olmesartan Other (See Comments)  ?  "bothered stomach"  ? Shellfish Allergy Nausea Only  ?  "Bad headache"  ? ? ?Patient Measurements: ?Height: '5\' 9"'$  (175.3 cm) ?Weight: 78.3 kg (172 lb 9.9 oz) ?IBW/kg (Calculated) : 70.7 ? ?Vital Signs: ?Temp: 100.6 ?F (38.1 ?C) (04/17 0800) ?Temp Source: Axillary (04/17 0800) ?BP: 92/67 (04/17 0800) ?Pulse Rate: 102 (04/17 0750) ? ?Labs: ?Recent Labs  ?  01/15/22 ?2126 01/16/22 ?0312 01/16/22 ?2725 01/16/22 ?1018 01/16/22 ?1611 01/17/22 ?0301 01/17/22 ?0454 01/17/22 ?3664 01/17/22 ?1039 01/17/22 ?1528 01/17/22 ?2037 01/18/22 ?4034 01/18/22 ?7425 01/18/22 ?9563  ?HGB  --  7.7*   < >  --    < > 8.3*  --   --  8.2*  --   --   --  8.2*  --   ?HCT  --  23.6*   < >  --    < > 25.4*  --   --  24.0*  --   --   --  25.5*  --   ?PLT  --  140*  --   --   --  135*  --   --   --   --   --   --  100*  --   ?APTT  --   --   --   --    < > 89*  --    < >  --    < > 56* 101*  --  54*  ?HEPARINUNFRC 0.13* 0.18*  --  0.20*  --   --   --   --   --   --   --   --   --   --   ?CREATININE  --  0.99  --   --   --   --  1.64*  --   --   --   --   --  1.85*  --   ? < > = values in this interval not displayed.  ? ? ?Estimated Creatinine Clearance: 42.5 mL/min (A) (by C-G formula based on SCr of 1.85 mg/dL (H)). ? ?Assessment: ?61 yo M with metastatic pancreatic cancer admitted with sepsis and acute metabolic encephalopathy. Pt started on heparin for bilateral PE with right heart strain and acute bilateral DVTs. Pt also with large right cerebellar stroke. Neurology and primary team in agreement with anticoagulation due to large clot burden even with new large stroke. Request no bolus and low  goal. ? ?Bivalirudin initiated after difficulty obtaining therapeutic heparin levels. CBC remains low but stable. No s/sx of bleeding reported. ? ?aPTT at goal s/p decrease to 0.025 mg/kg/hr ? ?Goal of Therapy:  ?aPTT goal 50-85 sec (target low end of goal) ?Monitor platelets by anticoagulation protocol: Yes ?  ?Plan:   ?Continue bivalirudin at 0.025 mg/kg/hr ?Daily CBC, aPTT q12h ?Monitor for s/sx of bleeding ? ?Bertis Ruddy, PharmD ?Clinical Pharmacist ?ED Pharmacist Phone # [phone] ?01/18/2022 9:24 AM ? ? ? ? ? ? ? ?

## 2022-01-18 NOTE — Progress Notes (Addendum)
? ?NAME:  DAMONTAE LOPPNOW, MRN:  [id number], DOB:  04/11/1961, LOS: 5 ?ADMISSION DATE:  01/25/2022, CONSULTATION DATE:  01/14/2022 ?REFERRING MD:  Dr. Benny Lennert, CHIEF COMPLAINT:  Code stroke   ? ?History of Present Illness:  ?61 y/o M with a PMH significant for pancreatic adenocarcinoma with mets to the liver and recent leptomeningeal spead on MRI brain/spine at [facility], diabetes, HTN, GERD, and failure to thrive who presented to the [facility] ED for sudden onset confusion.  ? ?On ED arrival patient was seen with stable vital signs but new underlying confusion persisted. Labwork significant for leukocytosis, anemia, AKI, and elevated liver enzymes.  CT angio chest/abdomen/pelvis obtained on arrival and revealed bilateral pulmonary emboli with CT evidence of right heart strain as well as known distal pancreatic tail mass with mild ascites and splenic venous occlusion of chronic nature.  Head CT on admit negative ? 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 ?Given multiple medical conditions with new acute strokes neurology and critical care was consulted to assist in further care. Patient will be transferred to [facility] for further neurologic care.  ? ?Pertinent  Medical History  ?Pancreatic adenocarcinoma with mets to the liver and recent leptomeningeal spear on MRI brain/spine at [facility], diabetes, HTN, GERD, and failure to thrive ? ?Significant Hospital Events: ?Including procedures, antibiotic start and stop dates in addition to other pertinent events   ?4/12 admitted with new onset confusion found to have bilateral PE with CT evidence of right heart strain  ?4/13 MRI brain was completed and revealed acute to early subacute right PICA infarct with mild edema and partial effacement of fourth  ventricle, scattered additional smaller infarcts, potentially occluded proximal right V4 segment flow, and known leptomeningeal disease. PCCM consulted ?4/15 CTH shows stable R PICA infarct, no hemorrhagic transformation, worsening hypoxic failure and R-sided PNA, likely aspirating, change to Unasyn/Vanc ? ?Interim History / Subjective:  ? ?Febrile overnight up to 101.7. Remains on Levophed and Precedex.  ? ?No responding to verbal or painful.  ? ?Objective   ?Blood pressure 99/69, pulse 99, temperature 98.3 ?F (36.8 ?C), temperature source Axillary, resp. rate (!) 23, height '5\' 9"'$  (1.753 m), weight 78.3 kg, SpO2 100 %. ?   ?Vent Mode: PRVC ?FiO2 (%):  [40 %-100 %] 40 % ?Set Rate:  [24 bmp-30 bmp] 30 bmp ?Vt Set:  [560 mL] 560 mL ?PEEP:  [5 cmH20] 5 cmH20 ?Plateau Pressure:  [18 cmH20-27 cmH20] 27 cmH20  ? ?Intake/Output Summary (Last 24 hours) at 01/18/2022 0737 ?Last data filed at 01/18/2022 0700 ?Gross per 24 hour  ?Intake 4819.71 ml  ?Output 550 ml  ?Net 4269.71 ml  ? ? ?Filed Weights  ? 01/14/22 0100 01/17/22 0515 01/18/22 0500  ?Weight: 71.1 kg 81.8 kg 78.3 kg  ? ?Physical exam: ?General: Crtitically ill-appearing male, lying on the bed ?HEENT: [place]/AT, eyes anicteric.  Moist mucus membranes  ?Neuro: PEERL. Not following commands. Non-responsive to painful stimuli in all 4 extremities.  ?Chest: Coarse breathe sounds throughout, however no wheezing or rales.  ?Heart: Regular rate and rhythm, no murmurs or gallops ?Abdomen: Soft, nontender, nondistended, hypoactive bowel sounds  ?Skin: No rash ? ?Resolved Hospital Problem list   ? ? ?Assessment & Plan:  ? ?Acute Hypoxic Respiratory Failure ?Mixed respiratory and metabolic acidosis ?Severe sepsis with septic shock  due to aspiration pneumonia, POA ?- In the setting of acute encephalopathy with failed swallow study. Required intubation on 4/16.  ?- Continue full ventilatory support ?- Follow-up respiratory culture. So far, growing gram positive cocci in pairs. ?-  Continue IV Unasyn ?- Continue Levophed with map goal 65 ? ?Acute bilateral PE with CT evidence of right heart strain  ?Bilateral acute lower extremity DVT  ?- High risk for intracranial bleeding given new strokes, however CTH without hemorrhagic transformation 4/17 ?- Continue Bivalirudin per pharmacy dosing  ?- Continue to monitor for neurological changes  ?- Patient may ultimately benefit from IVC filter if any improvements in prognosis occur  ? 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 Repeat CTH this AM demonstrates persistent cytotoxic edema surrounding right PICA territory. No changes in mass effect on 4th ventricle ?- No longer requiring 3% saline to maintain Na goal 150-155 ?- Continue secondary stroke prevention ? ?Severe Leukocytosis  ?- Likely leukemoid reaction. WBC continues to rise up to maximum 47 today.  ?- Daily CBC ? ?Stage IV Metastatic pancreatic cancer with mets to the liver and brain  ?- Followed at [facility], was receiving palliative Folfirinox at [facility]  ?- Continue supportive care ?- Avoid hepatotoxin agents  ? ?Severe protein calorie malnutrition  ?Dysphagia ?- Continue dietary supplements with tube feeds ? ?Stage II sacral pressure injury, POA ?- Continue wound care ? ?Anemia and thrombocytopenia ?- Hemoglobin stable although platelets have continued to decrease. No signs of bleeding on examination.  ? ?Type 2 diabetes  ?- Continue sliding scale insulin with CBG goal 140-180 ? ?Acute kidney injury ?Hyperkalemia ?- Repeat BMP pending this AM  ? ?Best Practice (right click and "Reselect all SmartList Selections" daily)  ?Diet/type: TF,  NPO ?DVT prophylaxis: Bivalirudin ?GI prophylaxis: PPI ?Lines: N/A ?Foley:  N/A ?Code Status:  full code ?Last date of multidisciplinary goals of care discussion: Per neurology and palliative care family wishes to proceed with full scope of care and to remain full code.  Last meeting on 4/15. Per daughter and patient, continue full code. Will contact family this AM as patient's prognosis remains incredibly poor and patient is unlikely to survive this hospitalization.  ? ?Dr. Jose Persia ?Internal Medicine PGY-3  ?01/18/2022, 7:53 AM ? ?

## 2022-01-18 NOTE — Progress Notes (Signed)
eLink Physician-Brief Progress Note ?Patient Name: Michael Kemp ?DOB: October 15, 1960 ?MRN: [id number] ? ? ?Date of Service ? 01/18/2022  ?HPI/Events of Note ? Hyperglycemia - Blood glucose = 477.  ?eICU Interventions ? Will repeat blood glucose STAT before committing to an insulin IV infusion.   ? ? ? ?Intervention Category ?Major Interventions: Hyperglycemia - active titration of insulin therapy ? ?Michael Kemp [name] ?01/18/2022, 3:21 AM ?

## 2022-01-18 NOTE — Progress Notes (Signed)
STROKE TEAM PROGRESS NOTE  ? ?INTERVAL HISTORY ?RN is at the bedside. Pt still intubated, less responsive. BP still low on levophed, vasopressor requirement increased. Fever over night.  ? ?Vitals:  ? 01/18/22 1000 01/18/22 1100 01/18/22 1200 01/18/22 1300  ?BP: 96/65 (!) 85/58 (!) 100/59 94/66  ?Pulse:    (!) 166  ?Resp: (!) '25 20 20 '$ (!) 29  ?Temp:   (!) 97.5 ?F (36.4 ?C)   ?TempSrc:   Axillary   ?SpO2:    96%  ?Weight:      ?Height:      ? ?CBC:  ?Recent Labs  ?Lab 01/14/22 ?6295 01/15/22 ?0309 01/15/22 ?1147 01/17/22 ?0301 01/17/22 ?1039 01/18/22 ?2841  ?WBC 21.0* 19.7*   < > 31.0*  --  47.5*  ?NEUTROABS 17.6* 17.7*  --   --   --   --   ?HGB 7.5* 7.3*   < > 8.3* 8.2* 8.2*  ?HCT 22.6* 22.6*   < > 25.4* 24.0* 25.5*  ?MCV 85.9 86.9   < > 87.3  --  87.3  ?PLT 108* 135*   < > 135*  --  100*  ? < > = values in this interval not displayed.  ? ?Basic Metabolic Panel:  ?Recent Labs  ?Lab 01/14/22 ?3244 01/14/22 ?1353 01/17/22 ?0454 01/17/22 ?0102 01/17/22 ?1039 01/17/22 ?1528 01/18/22 ?7253 01/18/22 ?6644  ?NA 138   < > 154*   < > 155*   < > 152*  153* 152*  ?K 4.1   < > 5.4*  --  4.4  --  4.2  --   ?CL 103   < > 126*  --   --   --  126*  --   ?CO2 27   < > 18*  --   --   --  19*  --   ?GLUCOSE 107*   < > 115*  --   --   --  216*  --   ?BUN 30*   < > 50*  --   --   --  69*  --   ?CREATININE 0.78   < > 1.64*  --   --   --  1.85*  --   ?CALCIUM 7.6*   < > 7.7*  --   --   --  7.7*  --   ?MG 1.9  --   --   --   --   --   --   --   ?PHOS 4.7*  --   --   --   --   --   --   --   ? < > = values in this interval not displayed.  ? ?Lipid Panel:  ?Recent Labs  ?Lab 01/16/22 ?0312  ?CHOL 66  ?TRIG 55  ?HDL 14*  ?CHOLHDL 4.7  ?VLDL 11  ?[place] 41  ? ?HgbA1c:  ?Recent Labs  ?Lab 01/14/22 ?0416  ?HGBA1C 5.8*  ? ?Urine Drug Screen:  ?Recent Labs  ?Lab 01/22/2022 ?2325  ?LABOPIA NONE DETECTED  ?COCAINSCRNUR NONE DETECTED  ?LABBENZ NONE DETECTED  ?AMPHETMU NONE DETECTED  ?THCU NONE DETECTED  ?LABBARB NONE DETECTED  ?  ?Alcohol Level No  results for input(s): ETH in the last 168 hours. ? ?IMAGING past 24 hours ?CT HEAD WO CONTRAST (5MM) ? ?Result Date: 01/18/2022 ?CLINICAL DATA:  32 61 year old malei with right PICA infarct with posterior fossa mass effect. Right vertebral artery occlusion. Metastatic pancreatic cancer. EXAM: CT HEAD WITHOUT CONTRAST TECHNIQUE: Contiguous axial images were obtained from  the base of the skull through the vertex without intravenous contrast. RADIATION DOSE REDUCTION: This exam was performed according to the departmental dose-optimization program which includes automated exposure control, adjustment of the mA and/or kV according to patient size and/or use of iterative reconstruction technique. COMPARISON:  Head CT 01/16/2022 and earlier. FINDINGS: Brain: Continued confluent cytotoxic edema of relatively large right cerebellar PICA territory infarct. No hemorrhagic transformation. Mildly increased effacement of the 4th ventricle (series 6, image 40) however, no interval loss of basilar cisterns and no interval enlargement of the lateral or 3rd ventricles. Elsewhere gray-white matter differentiation is stable. Scattered small supratentorial infarcts on MRI 01/14/2022 remain occult by CT. No acute intracranial hemorrhage or new ischemia identified. Vascular: Calcified atherosclerosis at the skull base. Skull: No acute osseous abnormality identified. Sinuses/Orbits: Visualized paranasal sinuses and mastoids are stable and well aerated. Other: Visualized orbits and scalp soft tissues are within normal limits. IMPRESSION: 1. Right PICA territory infarct with unrelenting cytotoxic edema, but no hemorrhagic transformation. Mass effect on the 4th ventricle persists, but there is no ventriculomegaly, and basilar cisterns remain patent. 2. No new intracranial abnormality. Electronically Signed   By: Genevie Ann M.D.   On: 01/18/2022 05:07   ? ?PHYSICAL EXAM ? ?Temp:  [97.5 ?F (36.4 ?C)-101.7 ?F (38.7 ?C)] 97.5 ?F (36.4 ?C) (04/17  1200) ?Pulse Rate:  [90-166] 166 (04/17 1300) ?Resp:  [0-33] 29 (04/17 1300) ?BP: (82-107)/(58-83) 94/66 (04/17 1300) ?SpO2:  [92 %-100 %] 96 % (04/17 1300) ?Arterial Line BP: (84)/(50) 84/50 (04/17 1300) ?FiO2 (%):  [40 %] 40 % (04/17 0750) ?Weight:  [78.3 kg] 78.3 kg (04/17 0500) ? ?General - Well nourished, well developed, intubated on precedex. ? ?Ophthalmologic - fundi not visualized due to noncooperation. ? ?Cardiovascular - Regular rhythm but tachycardia. ? ?Neuro - intubated on precedex, eyes closed, not following commands. With forced eye opening, eyes in mid position, not blinking to visual threat, doll's eyes sluggish, not tracking, pupils equal 63m, sluggish to light. Corneal reflex present, gag and cough weak. Breathing over the vent.  Facial symmetry not able to test due to ET tube.  Tongue protrusion not cooperative. On pain stimulation, no movement of all extremities. No babinski. Sensation, coordination and gait not tested. ? ? ? ?ASSESSMENT/PLAN ?Mr. Michael FRANCHINOis a 61y.o. male with history of pancreatic cancer with metastases to the liver and brain and HTN presenting with acute onset vertigo, nausea and vomiting.  He presented to the ED and was found to have an acute large right cerebellar stroke.  He was also found to have acute bilateral PEs and DVTs along with sepsis from an unclear source.  He was started on IV heparin for the PEs and DVTs and was given 3% HTS to treat cerebellar edema caused by his stroke. Na is within therapeutic range, HTS stopped.  Heparin was changed to bivalrudin due to inability to obtain therapeutic level.  Family wishes to continue aggressive measures.  Patient has been intubated today and is now requiring norepinephrine to maintain his MAP goal.  ? 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  MRI  acute to early subacute right PICA infarct with mild edema and partial effacement of fourth ventricle, no hydrocephalus, scattered additional infarcts in bilateral cerebral and cerebellar hemispheres ?CT repeat 4/15 stable right PICA infarct without hydrocephalus ?CT repeat 4/17 stable infarct with mass effect but no hydro ?2D Echo EF 60-65%moderate LVH, normal atrial septum ?LDL 41. ?HgbA1c 5.8 ?VTE prophylaxis - fully anticoagulated with bivalrudin ?No antithrombotic prior to admission, now on bivalrudin given difficulty with heparin level ?Therapy recommendations:  pending ?Disposition:  pending ? ?Respiratory Failure ?Patient failed swallow study and has likely aspirated ?CXR shows bilateral pneumonia, right pleural effusion ?Respirations labored and tachypneic ?On NRB 4/15 ?intubated by CCM 4/16 ? ?History of Hypertension, now hypotensive ?Home meds:  none ?Unstable, requirement of levophed increased ?MAP goal >65 ?Long-term BP goal normotensive ? ?Hyperlipidemia ?Home meds:  none ?LDL 41, goal < 70 ?Statin not indicated as LDL below goal ? ?Sepsis with uncertain source ?Follow cultures - so far neg ?Antibiotics per primary team ?MAP goal >65 ? ?Acute bilateral PE with bilateral lower extremity DVT ?Bilateral DVTs seen on lower extremity doppler ?Acute bilateral PE with evidence of right heart strain seen on CT chest ?anticoagulated with IV heparin -> bivalrudin now ?Management per primary team ? 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 ?Other Stroke Risk Factors ?Full code per daughter ? ?Other  Active Problems ?Enlargement of right lobe of thyroid gland ?Nonurgent thyroid ultrasound  ? ?Hospital day # 5 ? ?Rosalin Hawking, MD PhD ?Stroke Neurology ?01/18/2022 ?1:17 PM ? ? ?This patient is critically ill due to la

## 2022-01-18 NOTE — Progress Notes (Signed)
Nutrition Follow-up ? ?DOCUMENTATION CODES:  ? ?Non-severe (moderate) malnutrition in context of chronic illness ? ?INTERVENTION:  ?- will monitor medical course.  ? ? ?NUTRITION DIAGNOSIS:  ? ?Moderate Malnutrition related to chronic illness, cancer and cancer related treatments as evidenced by mild muscle depletion, moderate muscle depletion, moderate fat depletion. -ongoing ? ?GOAL:  ? ?Patient will meet greater than or equal to 90% of their needs -met with TF regimen ? ?MONITOR:  ? ?Vent status, TF tolerance, Labs, Weight trends ? ?REASON FOR ASSESSMENT:  ? ?Ventilator ? ?ASSESSMENT:  ? ?61 y.o. male with medical history of pancreatic adenocarcinoma with mets to liver and recent findings of leptomeningeal spread on MRI brain/spine at [facility] (12/27/21), type 2 DM, failure to thrive in an adult, HTN, GERD. He presented to the ED from home due to sudden onset of confusion and abdominal pain. He was admitted with diagnosis of sepsis. ? ?Patient seen in person by this RD at [facility] on 4/13 prior to transferring to [facility]N. Patient with PEG from PTA.  ? ?He was intubated yesterday AM.  ? ?Able to communicate with RN via secure chat. No issues with TF regimen this shift. As of 1209 today patient was receiving ordered goal TF regimen of Osmolite 1.5 @ 65 ml/hr with 90 ml Prosource TF BID. ? ?This regimen provides 2500 kcal, 142 grams protein, and 1189 ml free water. ? ?Patient currently maxed on levo and vaso and bivalirudin added. Patient noted to now be DNR. ? ?Weight is +16 lb compared to weight on 4/13. Moderate generalized pitting edema documented in the edema section of flow sheet this AM. Noted to be +11.85 L since admission. ? ? ?Patient is currently intubated on ventilator support ?MV: 17.9 L/min ?Temp (24hrs), Avg:99.3 ?F (37.4 ?C), Min:97.5 ?F (36.4 ?C), Max:101.7 ?F (38.7 ?C) ?Propofol: none ? ?Labs reviewed; CBGs: 477, 169, 155, 139 mg/dl, Na: 154 mmol/l, Cl: 126 mmol/l, BUN: 69 mg/dl, creatinine: 1.85 mg/dl,  Ca: 7.7 mg/dl, GFR: 41 ml/min. ? ?Medications reviewed; 100 mg colace BID, sliding scale novolog, 40 mg IV protonix/day, 17 g miralax/day, 100 mg thiamine/day.  ? ?Drips; levo @ 40 mcg/min, vaso @ 0.04 units/min, precedex @ 0.4 mcg/kg/hr, bivalirudin @ 0.025 mg/kg/hr. ? ? ?Diet Order:   ?Diet Order   ? ?       ?  Diet NPO time specified  Diet effective now       ?  ? ?  ?  ? ?  ? ? ?EDUCATION NEEDS:  ? ?No education needs have been identified at this time ? ?Skin:  Skin Assessment: Skin Integrity Issues: ?Skin Integrity Issues:: Stage II ?Stage II: bilateral coccyx ? ?Last BM:  4/15 (type 6, smear) ? ?Height:  ? ?Ht Readings from Last 1 Encounters:  ?01/17/22 5' 9"  (1.753 m)  ? ? ?Weight:  ? ?Wt Readings from Last 1 Encounters:  ?01/18/22 78.3 kg  ? ? ?BMI:  Body mass index is 25.49 kg/m?. ? ?Estimated Nutritional Needs:  ?Kcal:  2261 kcal ?Protein:  142-156 grams ?Fluid:  >/= 2.2 L/day ? ? ? ? ?Jarome Matin, MS, RD, LDN ?Registered Dietitian II ?Inpatient Clinical Nutrition ?RD pager # and on-call/weekend pager # available in [place]  ? ?

## 2022-01-18 NOTE — Progress Notes (Signed)
Patient transported on ventilator to CT and back with no complications. Vitals stable.  ?

## 2022-01-18 NOTE — Progress Notes (Signed)
Spoke with RN Chase concerning the patient's IV access at this time. The patient has two PIV's that are infusing and one Port a cath that is infusing, RN is stating that the patient needs more IV access. At this time the recommendation would be a picc line or central for the patient.  ?

## 2022-01-18 NOTE — Care Plan (Signed)
Discussed patient's current clinical status with patient's daughter Jonelle Sidle and sisters, including sister Evelena Peat. We discussed that patient is currently in multi-organ failure with increasing pressor requirement. Due to underlying malignancy with diffuse metastasis and multi-organ failure, resuscitation efforts in the case of an arrest would be unlikely successful. Jonelle Sidle stated she did not want to cause unnecessary discomfort and pain for her father. With this in mind, she would like to transition to DNR. Patient's sisters at bedside are in agreement with this decision.  ? ? ?- Changed code status to DNR ?- If patient should pass, please contact patient's sister Evelena Peat Mapp-Wade first. She will contact patient's daughter. Jessie's number is listed first in chart  ? ? ?Dr. Jose Persia ?Internal Medicine PGY-3  ?01/18/2022, 3:45 PM ? ?

## 2022-01-18 NOTE — Progress Notes (Signed)
ANTICOAGULATION CONSULT NOTE- follow-up ? ?Pharmacy Consult for bivalirudin ?Indication: pulmonary embolus, DVT, and stroke ? ?Allergies  ?Allergen Reactions  ? Lisinopril Anaphylaxis  ? Amoxicillin Nausea And Vomiting  ? Aripiprazole Other (See Comments)  ?  Made patient feel "really low" per daughter at bedside  ? Olmesartan Other (See Comments)  ?  "bothered stomach"  ? Shellfish Allergy Nausea Only  ?  "Bad headache"  ? ? ?Patient Measurements: ?Height: '5\' 9"'$  (175.3 cm) ?Weight: 78.3 kg (172 lb 9.9 oz) ?IBW/kg (Calculated) : 70.7 ? ?Vital Signs: ?Temp: 101.9 ?F (38.8 ?C) (04/17 1553) ?Temp Source: Axillary (04/17 1553) ?BP: 106/76 (04/17 1800) ?Pulse Rate: 118 (04/17 1800) ? ?Labs: ?Recent Labs  ?  01/15/22 ?2126 01/16/22 ?0312 01/16/22 ?7209 01/16/22 ?1018 01/16/22 ?1611 01/17/22 ?0301 01/17/22 ?0454 01/17/22 ?4709 01/17/22 ?1039 01/17/22 ?1528 01/18/22 ?6283 01/18/22 ?6629 01/18/22 ?4765 01/18/22 ?1315 01/18/22 ?1730  ?HGB  --  7.7*   < >  --    < > 8.3*  --   --  8.2*  --   --  8.2*  --  8.8*  --   ?HCT  --  23.6*   < >  --    < > 25.4*  --   --  24.0*  --   --  25.5*  --  26.0*  --   ?PLT  --  140*  --   --   --  135*  --   --   --   --   --  100*  --   --   --   ?APTT  --   --   --   --    < > 89*  --    < >  --    < > 101*  --  54*  --  39*  ?HEPARINUNFRC 0.13* 0.18*  --  0.20*  --   --   --   --   --   --   --   --   --   --   --   ?CREATININE  --  0.99  --   --   --   --  1.64*  --   --   --   --  1.85*  --   --   --   ? < > = values in this interval not displayed.  ? ? ?Estimated Creatinine Clearance: 42.5 mL/min (A) (by C-G formula based on SCr of 1.85 mg/dL (H)). ? ?Assessment: ?61 yo M with metastatic pancreatic cancer admitted with sepsis and acute metabolic encephalopathy. Pt started on heparin for bilateral PE with right heart strain and acute bilateral DVTs. Pt also with large right cerebellar stroke. Neurology and primary team in agreement with anticoagulation due to large clot burden even with  new large stroke. Request no bolus and low goal. ? ?Bivalirudin initiated after difficulty obtaining therapeutic heparin levels. CBC remains low but stable. No s/sx of bleeding reported. ? ?aPTT 39 sec ?Discussed with nurse, infusion is running. No signs/symptoms of bleed ? ?Goal of Therapy:  ?aPTT goal 50-85 sec (target low end of goal) ?Monitor platelets by anticoagulation protocol: Yes ?  ?Plan:   ?Increase bivalirudin to 0.03 mg/kg/hr.  ?Aptt previously elevated on 0.0375 mg/kg/hr ?Aptt at 2300 for further dosing ?Daily CBC, aPTT q12h ?Monitor for s/sx of bleeding ? ?Thank you for allowing pharmacy to be a part of this patient?s care. ? ?Donnald Garre, PharmD ?Clinical Pharmacist ? ?Please check AMION for all [facility]  Pharmacy numbers ?After 10:00 PM, call [place] [phone] ? ? ? ? ? ? ? ?

## 2022-01-18 NOTE — Procedures (Signed)
Arterial Catheter Insertion Procedure Note ? ?Michael Kemp  ?[id number]  ?March 11, 1961 ? ?Date:01/18/22  ?Time:12:48 PM  ? ? ?Provider Performing: Bayard Beaver  ? ? ?Procedure: Insertion of Arterial Line [phone]) with US guidance (09295)  ? ?Indication(s) ?Blood pressure monitoring and/or need for frequent ABGs ? ?Consent ?Risks of the procedure as well as the alternatives and risks of each were explained to the patient and/or caregiver.  Consent for the procedure was obtained and is signed in the bedside chart ? ?Anesthesia ?None ? ? ?Time Out ?Verified patient identification, verified procedure, site/side was marked, verified correct patient position, special equipment/implants available, medications/allergies/relevant history reviewed, required imaging and test results available. ? ? ?Sterile Technique ?Maximal sterile technique including full sterile barrier drape, hand hygiene, sterile gown, sterile gloves, mask, hair covering, sterile ultrasound probe cover (if used). ? ? ?Procedure Description ?Area of catheter insertion was cleaned with chlorhexidine and draped in sterile fashion. Without real-time ultrasound guidance an arterial catheter was placed into the left radial artery.  Appropriate arterial tracings confirmed on monitor.   ? ? ?Complications/Tolerance ?None; patient tolerated the procedure well. ? ? ?EBL ?Minimal ? ? ?Specimen(s) ?None ? ?

## 2022-01-19 ENCOUNTER — Inpatient Hospital Stay (HOSPITAL_COMMUNITY): Payer: Medicaid Other

## 2022-01-19 DIAGNOSIS — C259 Malignant neoplasm of pancreas, unspecified: Secondary | ICD-10-CM

## 2022-01-19 DIAGNOSIS — A4101 Sepsis due to Methicillin susceptible Staphylococcus aureus: Secondary | ICD-10-CM | POA: Diagnosis not present

## 2022-01-19 DIAGNOSIS — I2699 Other pulmonary embolism without acute cor pulmonale: Secondary | ICD-10-CM | POA: Diagnosis not present

## 2022-01-19 DIAGNOSIS — J9601 Acute respiratory failure with hypoxia: Secondary | ICD-10-CM | POA: Diagnosis not present

## 2022-01-19 DIAGNOSIS — I639 Cerebral infarction, unspecified: Secondary | ICD-10-CM | POA: Diagnosis not present

## 2022-01-19 DIAGNOSIS — C787 Secondary malignant neoplasm of liver and intrahepatic bile duct: Secondary | ICD-10-CM

## 2022-01-19 LAB — CBC
HCT: 22.9 % — ABNORMAL LOW (ref 39.0–52.0)
HCT: 21.9 % — ABNORMAL LOW (ref 39.0–52.0)
HCT: 20.1 % — ABNORMAL LOW (ref 39.0–52.0)
Hemoglobin: 7.3 g/dL — ABNORMAL LOW (ref 13.0–17.0)
Hemoglobin: 7 g/dL — ABNORMAL LOW (ref 13.0–17.0)
Hemoglobin: 6.1 g/dL — CL (ref 13.0–17.0)
MCH: 28.2 pg (ref 26.0–34.0)
MCH: 27.8 pg (ref 26.0–34.0)
MCH: 27.9 pg (ref 26.0–34.0)
MCHC: 31.9 g/dL (ref 30.0–36.0)
MCHC: 31.5 g/dL (ref 30.0–36.0)
MCHC: 30.3 g/dL (ref 30.0–36.0)
MCV: 88.4 fL (ref 80.0–100.0)
MCV: 88.3 fL (ref 80.0–100.0)
MCV: 91.8 fL (ref 80.0–100.0)
Platelets: UNDETERMINED 10*3/uL (ref 150–400)
Platelets: 33 10*3/uL — ABNORMAL LOW (ref 150–400)
Platelets: 34 10*3/uL — ABNORMAL LOW (ref 150–400)
RBC: 2.59 MIL/uL — ABNORMAL LOW (ref 4.22–5.81)
RBC: 2.48 MIL/uL — ABNORMAL LOW (ref 4.22–5.81)
RBC: 2.19 MIL/uL — ABNORMAL LOW (ref 4.22–5.81)
RDW: 18.8 % — ABNORMAL HIGH (ref 11.5–15.5)
RDW: 19.1 % — ABNORMAL HIGH (ref 11.5–15.5)
RDW: 19.3 % — ABNORMAL HIGH (ref 11.5–15.5)
WBC: 50.5 10*3/uL (ref 4.0–10.5)
WBC: 51.2 10*3/uL (ref 4.0–10.5)
WBC: 53.8 10*3/uL (ref 4.0–10.5)
nRBC: 12.4 % — ABNORMAL HIGH (ref 0.0–0.2)
nRBC: 19.7 % — ABNORMAL HIGH (ref 0.0–0.2)
nRBC: 20.5 % — ABNORMAL HIGH (ref 0.0–0.2)

## 2022-01-19 LAB — COMPREHENSIVE METABOLIC PANEL
ALT: 105 U/L — ABNORMAL HIGH (ref 0–44)
AST: 154 U/L — ABNORMAL HIGH (ref 15–41)
Albumin: <1.5 g/dL — ABNORMAL LOW (ref 3.5–5.0)
Alkaline Phosphatase: 246 U/L — ABNORMAL HIGH (ref 38–126)
Anion gap: 16 — ABNORMAL HIGH (ref 5–15)
BUN: 89 mg/dL — ABNORMAL HIGH (ref 6–20)
CO2: 14 mmol/L — ABNORMAL LOW (ref 22–32)
Calcium: 7.2 mg/dL — ABNORMAL LOW (ref 8.9–10.3)
Chloride: 121 mmol/L — ABNORMAL HIGH (ref 98–111)
Creatinine, Ser: 2.17 mg/dL — ABNORMAL HIGH (ref 0.61–1.24)
GFR, Estimated: 34 mL/min — ABNORMAL LOW (ref >60)
Glucose, Bld: 271 mg/dL — ABNORMAL HIGH (ref 70–99)
Potassium: 5 mmol/L (ref 3.5–5.1)
Sodium: 151 mmol/L — ABNORMAL HIGH (ref 135–145)
Total Bilirubin: 1.2 mg/dL (ref 0.3–1.2)
Total Protein: 4.8 g/dL — ABNORMAL LOW (ref 6.5–8.1)

## 2022-01-19 LAB — PROTIME-INR
INR: 6.3 (ref 0.8–1.2)
Prothrombin Time: 55.4 seconds — ABNORMAL HIGH (ref 11.4–15.2)

## 2022-01-19 LAB — DIC (DISSEMINATED INTRAVASCULAR COAGULATION)PANEL
D-Dimer, Quant: >20 ug/mL-FEU — ABNORMAL HIGH (ref 0.00–0.50)
Fibrinogen: 144 mg/dL — ABNORMAL LOW (ref 210–475)
INR: 6.2 (ref 0.8–1.2)
Platelets: 33 10*3/uL — ABNORMAL LOW (ref 150–400)
Prothrombin Time: 54.2 seconds — ABNORMAL HIGH (ref 11.4–15.2)
Smear Review: NONE SEEN
aPTT: 106 seconds — ABNORMAL HIGH (ref 24–36)

## 2022-01-19 LAB — GLUCOSE, CAPILLARY
Glucose-Capillary: 188 mg/dL — ABNORMAL HIGH (ref 70–99)
Glucose-Capillary: 205 mg/dL — ABNORMAL HIGH (ref 70–99)
Glucose-Capillary: 221 mg/dL — ABNORMAL HIGH (ref 70–99)
Glucose-Capillary: 234 mg/dL — ABNORMAL HIGH (ref 70–99)
Glucose-Capillary: 243 mg/dL — ABNORMAL HIGH (ref 70–99)

## 2022-01-19 LAB — SODIUM
Sodium: 152 mmol/L — ABNORMAL HIGH (ref 135–145)
Sodium: 153 mmol/L — ABNORMAL HIGH (ref 135–145)

## 2022-01-19 LAB — CULTURE, RESPIRATORY W GRAM STAIN: Culture: NORMAL

## 2022-01-19 LAB — APTT
aPTT: 96 seconds — ABNORMAL HIGH (ref 24–36)
aPTT: 98 seconds — ABNORMAL HIGH (ref 24–36)
aPTT: 138 seconds — ABNORMAL HIGH (ref 24–36)

## 2022-01-19 LAB — PATHOLOGIST SMEAR REVIEW

## 2022-01-19 MED ORDER — ACETAMINOPHEN 325 MG PO TABS
650.0000 mg | ORAL_TABLET | Freq: Once | ORAL | Status: AC
  Administered 2022-01-19: 650 mg
  Filled 2022-01-19: qty 2

## 2022-01-19 MED ORDER — ACETAMINOPHEN 325 MG PO TABS
650.0000 mg | ORAL_TABLET | ORAL | Status: DC | PRN
  Administered 2022-01-19: 650 mg
  Filled 2022-01-19: qty 2

## 2022-01-19 MED ORDER — NOREPINEPHRINE 16 MG/250ML-% IV SOLN
0.0000 ug/min | INTRAVENOUS | Status: DC
  Administered 2022-01-19: 41 ug/min via INTRAVENOUS
  Administered 2022-01-20: 42 ug/min via INTRAVENOUS
  Filled 2022-01-19: qty 250

## 2022-01-19 NOTE — Progress Notes (Signed)
eLink Physician-Brief Progress Note ?Patient Name: Michael Kemp ?DOB: [birth year] ?MRN: [id number] ? ? ?Date of Service ? 01/19/2022  ?HPI/Events of Note ? Hemoglobin 6.1, Platelets 34 K, INR 6.1, patient is planned for transition to comfort care in the AM.  ?eICU Interventions ? Angiomax discontinued.  ? ? ? ?  ? ?Frederik Pear ?01/19/2022, 9:22 PM ?

## 2022-01-19 NOTE — Progress Notes (Signed)
? ?NAME:  Michael Kemp, MRN:  [id number], DOB:  07-10-1961, LOS: 6 ?ADMISSION DATE:  01/19/2022, CONSULTATION DATE:  01/14/2022 ?REFERRING MD:  Dr. Benny Lennert, CHIEF COMPLAINT:  Code stroke   ? ?History of Present Illness:  ?61 y/o M with a PMH significant for pancreatic adenocarcinoma with mets to the liver and recent leptomeningeal spead on MRI brain/spine at [facility], diabetes, HTN, GERD, and failure to thrive who presented to the [facility] ED for sudden onset confusion.  ? ?On ED arrival patient was seen with stable vital signs but new underlying confusion persisted. Labwork significant for leukocytosis, anemia, AKI, and elevated liver enzymes.  CT angio chest/abdomen/pelvis obtained on arrival and revealed bilateral pulmonary emboli with CT evidence of right heart strain as well as known distal pancreatic tail mass with mild ascites and splenic venous occlusion of chronic nature.  Head CT on admit negative ? 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 ?Given multiple medical conditions with new acute strokes neurology and critical care was consulted to assist in further care. Patient will be transferred to [facility] for further neurologic care.  ? ?Pertinent  Medical History  ?Pancreatic adenocarcinoma with mets to the liver and recent leptomeningeal spear on MRI brain/spine at [facility], diabetes, HTN, GERD, and failure to thrive ? ?Significant Hospital Events: ?Including procedures, antibiotic start and stop dates in addition to other pertinent events   ?4/12 admitted with new onset confusion found to have bilateral PE with CT evidence of right heart strain  ?4/13 MRI brain was completed and revealed acute to early subacute right PICA infarct with mild edema and partial effacement of fourth  ventricle, scattered additional smaller infarcts, potentially occluded proximal right V4 segment flow, and known leptomeningeal disease. PCCM consulted ?4/15 CTH shows stable R PICA infarct, no hemorrhagic transformation, worsening hypoxic failure and R-sided PNA, likely aspirating, change to Unasyn/Vanc ?4/17: CTH with stable edema and infarct. Vasopressin added for worsening shock.  ? ?Interim History / Subjective:  ? ?Febrile overnight up to 102.6. Levophed and Vasopressin continued overnight.  ?Increased bruising noted on extremities. INR obtained and elevated at 6.3. No medication changes made.  ?Purulent drainage from PEG tube overnight that is new.  ? ?Objective   ?Blood pressure 103/77, pulse (!) 102, temperature 99.5 ?F (37.5 ?C), temperature source Axillary, resp. rate 20, height '5\' 9"'$  (1.753 m), weight 81.2 kg, SpO2 98 %. ?   ?Vent Mode: PRVC ?FiO2 (%):  [30 %-50 %] 30 % ?Set Rate:  [30 bmp] 30 bmp ?Vt Set:  [560 mL] 560 mL ?PEEP:  [5 cmH20] 5 cmH20 ?Plateau Pressure:  [10 cmH20-18 cmH20] 18 cmH20  ? ?Intake/Output Summary (Last 24 hours) at 01/19/2022 0725 ?Last data filed at 01/19/2022 0600 ?Gross per 24 hour  ?Intake 3122.23 ml  ?Output 800 ml  ?Net 2322.23 ml  ? ? ?Filed Weights  ? 01/17/22 0515 01/18/22 0500 01/19/22 0500  ?Weight: 81.8 kg 78.3 kg 81.2 kg  ? ?Physical exam:  ?General: Crtitically ill-appearing male, lying on the bed ?HEENT: [place]/AT, eyes anicteric.  Dry mucus membranes  ?Neuro: PERRL. Negative cough and gag reflex. Positive pupillary and corneal reflex. Not withdrawing to noxious stimuli.  ?Chest: Coarse breathe sounds throughout, however no wheezing or rales. Radial pulses only identifiable by doppler.  ?Heart: Tachycardia with regular rhythm, no murmurs  or gallops ?Abdomen: Soft, nontender, mildly distended, hypoactive bowel sounds  ?Skin: Diffuse dusky changes with ecchymosis of the bilateral upper extremities predominantly involving the palmar aspect of the hands and forearms. Also  present on the tip of the nose.  ? ?Resolved Hospital Problem list   ? ? ?Assessment & Plan:  ? ?Acute Hypoxic Respiratory Failure ?Mixed respiratory and metabolic acidosis ?Severe sepsis with septic shock due to aspiration pneumonia ?- In the setting of acute encephalopathy with failed swallow study. Required intubation on 4/16.  ?- CXR today with improvement in pleural effusion.  ?- Continue full ventilatory support ?- Daily SBT. Not a candidate for extubation though secondary to neurological status ?- Follow-up respiratory culture. So far, growing gram positive cocci in pairs. Re-incubated for better growth.  ?- Continue IV Unasyn ?- Continue Levophed and Vasopressin with map goal 65. Wean as tolerated.  ? ?Acute bilateral PE with CT evidence of right heart strain  ?Acute bilateral DVT, lower extremity ?- High risk for intracranial bleeding given new strokes, however CTH without hemorrhagic transformation 4/17 ?- Continue Bivalirudin per pharmacy dosing  ?- Continue to monitor for neurological changes  ?- Patient may ultimately benefit from IVC filter if any improvements in prognosis occur  ? 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 Repeat CTH this AM demonstrates persistent cytotoxic edema surrounding right PICA territory. No changes in mass effect on 4th ventricle ?- CVA secondary to hypercoagulability with metastatic malignancy   ?- No longer requiring 3% saline to maintain Na goal 150-155 ?- Continue secondary stroke prevention ? ?Acute kidney injury ?Hyperkalemia ?- Likely pre-renal in the setting of septic shock.  ?- Creatinine rising  today with developing hyperkalemia. UOP ~ 800 cc daily, however this is stable over the last week.  ?'- Patient is not a dialysis candidate.  ?- Follow creatinine daily ?- Strict UOP monitoring  ? ?Severe Leukocytosis  ?- Likely leukemoid reaction. WBC continues to rise.  ?- Daily CBC ? ?Shock Liver ?- LFT elevation likely in the setting of septic shock. INR is elevated as well, however this is more likely secondary to Angiomax, not fulminant liver failure.  ?- Daily CMP ? ?Stage IV Metastatic pancreatic cancer with mets to the liver and brain  ?- Followed at [facility], was receiving palliative Folfirinox at [facility]  ?- Continue supportive care ?- Avoid hepatotoxin agents  ? ?Normocytic Anemia  ?- Normal hemoglobin prior to extended hospitalization in 3/26 - 4/07. Since then, slowly down-trended and stable between 7-8.  ?- Daily CBC ? ?Thrombocytopenia  ? - Normal hemoglobin prior to extended hospitalization in 3/26 - 4/07. Since then, slowly down-trended and stable in the mild-moderate range.  ?- Skin changes today concerning for developing platelet dysfunction.  ?- Daily CBC  ? ?Type 2 diabetes  ?- Continue sliding scale insulin with CBG goal 140-180 ? ?Severe protein calorie malnutrition  ?Vitamin A, K deficiency  ?Dysphagia ?- +70 lb weight loss over the last 4 months. G-tube dependent now.  ?- Continue dietary supplements with tube feeds ? ?Stage II sacral pressure injury, POA ?- Continue wound care ? ?Best Practice (right click and "Reselect all SmartList Selections" daily)  ?Diet/type: TF, NPO ?DVT prophylaxis: Bivalirudin ?GI  prophylaxis: PPI ?Lines: N/A ?Foley:  N/A ?Code Status:  DNR ?Last date of multidisciplinary goals of care discussion: 4/17. Code status changed to DNR. Otherwise, continue with full scope of care.  ? ?Dr. Jose Persia ?Internal Medicine PGY-3  ?01/19/2022, 7:25 AM ? ?

## 2022-01-19 NOTE — Progress Notes (Signed)
Physical Therapy Discharge ?Patient Details ?Name: Michael Kemp ?MRN: [id number] ?DOB: [birth year] ?Today's Date: 01/19/2022 ?Time:  -  ?  ? ?Patient discharged from PT services secondary to medical decline - will need to re-order PT to resume therapy services ? ?Please see latest therapy progress note for current level of functioning and progress toward goals.   ? ?Progress and discharge plan discussed with patient and/or caregiver: Patient unable to participate in discharge planning and no caregivers available ? ?Leighton Roach, PT  ?Acute Rehab Services ? Pager [phone] ?Office [phone] ? ?   ? ?[place] ?01/19/2022, 11:11 AM  ?

## 2022-01-19 NOTE — Progress Notes (Signed)
eLink Physician-Brief Progress Note ?Patient Name: Michael Kemp ?DOB: 1961/07/12 ?MRN: [id number] ? ? ?Date of Service ? 01/19/2022  ?HPI/Events of Note ? PT = 55.4 and INR - 6.3. Spoke with Pharmacy at [facility]. Pharmacy feels that high elevations of PT/INR such as these are commonly seen with Angiomax. They are comfortable with the current Angiomax dosing as the PTT is 52 (low end of therapeutic). They recommend no change in Angiomax dosing.   ?eICU Interventions ? Continue present management.   ? ? ? ?Intervention Category ?Major Interventions: Other: ? ?Matheau Orona [name] ?01/19/2022, 1:41 AM ?

## 2022-01-19 NOTE — Progress Notes (Signed)
OT Cancellation Note ? ?Patient Details ?Name: Michael Kemp ?MRN: [id number] ?DOB: 1960-12-26 ? ? ?Cancelled Treatment:    Reason Eval/Treat Not Completed: Medical issues which prohibited therapy (OT orders have been discontinued, will sign off, thank you.) ? ?Kasarah Sitts A Zanae Kuehnle ?01/19/2022, 10:54 AM ?

## 2022-01-19 NOTE — Care Plan (Signed)
Interval Progress Note:  ? ?Repeat CBC this afternoon with decreased platelet count at 33. Possible that it is inaccurate due to clumping, however given elevated INR and skin findings on examination, will go ahead and get DIC panel.  ? ?Family discussion with patient's sister, Evelena Peat (in person), Doroteo Bradford (in person) and daughter Jonelle Sidle (via telephone). We discussed further decline clinically, including worsening AKI with hyperkalemia and severe acidemia, persistent fevers, diffuse ecchymosis. Patient is tolerating PSV, however neurological examination demonstrates no purposeful movement and absence of cough/gag reflex. The likelihood of meaningful recovery is extremely low. I recommended consideration of focusing on quality and not quantity with transition to comfort measures. Patient sister Evelena Peat states it is very difficult to see her brother suffering like so. She is in agreement with comfort care. Jonelle Sidle states she would like to give him till tomorrow morning. At that time, she would like to transition to comfort measures with terminal extubation. Evelena Peat and [place] agree with that decision.  ? ?- Continue full scope of care for now ?- Plan for terminal extubation with transition to comfort measures tomorrow morning ?- Increase Tylenol to 650 mg every 4 hours PRN for fever ?- Ice packs PRN ? ? ?Dr. Jose Persia ?Internal Medicine PGY-3  ?01/19/2022, 3:53 PM ? ?

## 2022-01-19 NOTE — Progress Notes (Addendum)
ANTICOAGULATION CONSULT NOTE - Follow Up Consult ? ?Pharmacy Consult for bivalirudin ?Indication:  PE/DVT in setting of stroke ? ?Labs: ?Recent Labs  ?  01/16/22 ?0312 01/16/22 ?9622 01/16/22 ?1018 01/16/22 ?1611 01/17/22 ?0301 01/17/22 ?0454 01/17/22 ?2979 01/17/22 ?1039 01/17/22 ?1528 01/18/22 ?8921 01/18/22 ?1941 01/18/22 ?1315 01/18/22 ?1730 01/18/22 ?2245  ?HGB 7.7*   < >  --    < > 8.3*  --   --  8.2*  --  8.2*  --  8.8*  --   --   ?HCT 23.6*   < >  --    < > 25.4*  --   --  24.0*  --  25.5*  --  26.0*  --   --   ?PLT 140*  --   --   --  135*  --   --   --   --  100*  --   --   --   --   ?APTT  --   --   --    < > 89*  --    < >  --    < >  --  54*  --  39* 52*  ?HEPARINUNFRC 0.18*  --  0.20*  --   --   --   --   --   --   --   --   --   --   --   ?CREATININE 0.99  --   --   --   --  1.64*  --   --   --  1.85*  --   --   --   --   ? < > = values in this interval not displayed.  ? ? ?Assessment/Plan:  ?61yo male therapeutic on bivalirudin after rate change.  Of note pt has increased bruising and swelling of RUE/BLE; PTT at very low end of range and would not adjust based on this. Will continue infusion at current rate of 0.03 mg/kg/hr and confirm stable with am labs.  ? ?Wynona Neat, PharmD, BCPS  ?01/19/2022,12:52 AM ? ? ?

## 2022-01-19 NOTE — Progress Notes (Signed)
STROKE TEAM PROGRESS NOTE  ? ?INTERVAL HISTORY ?RN is at the bedside. Pt is ntubated, less responsive. Hypotensive on levophed, vasopressor ggt. Fever over night. Increased bruising on ext/nose. ? ?Vitals:  ? 01/19/22 1000 01/19/22 1100 01/19/22 1128 01/19/22 1149  ?BP:   (!) 102/58   ?Pulse: (!) 110  (!) 125   ?Resp: 19 (!) 26 (!) 25   ?Temp:    (!) 102.6 ?F (39.2 ?C)  ?TempSrc:    Axillary  ?SpO2: 92%  96%   ?Weight:      ?Height:      ? ?CBC:  ?Recent Labs  ?Lab 01/14/22 ?6606 01/15/22 ?0309 01/15/22 ?1147 01/19/22 ?3016 01/19/22 ?1333  ?WBC 21.0* 19.7*   < > 50.5* 51.2*  ?NEUTROABS 17.6* 17.7*  --   --   --   ?HGB 7.5* 7.3*   < > 7.3* 7.0*  ?HCT 22.6* 22.6*   < > 22.9* 21.9*  ?MCV 85.9 86.9   < > 88.4 88.3  ?PLT 108* 135*   < > PLATELET CLUMPS NOTED ON SMEAR, UNABLE TO ESTIMATE PENDING  ? < > = values in this interval not displayed.  ? ? ?Basic Metabolic Panel:  ?Recent Labs  ?Lab 01/14/22 ?0109 01/14/22 ?1353 01/18/22 ?3235 01/18/22 ?5732 01/18/22 ?1315 01/18/22 ?1730 01/19/22 ?2025 01/19/22 ?4270  ?NA 138   < > 152*  153*   < > 154*   < > 151*  152* 153*  ?K 4.1   < > 4.2  --  4.5  --  5.0  --   ?CL 103   < > 126*  --   --   --  121*  --   ?CO2 27   < > 19*  --   --   --  14*  --   ?GLUCOSE 107*   < > 216*  --   --   --  271*  --   ?BUN 30*   < > 69*  --   --   --  89*  --   ?CREATININE 0.78   < > 1.85*  --   --   --  2.17*  --   ?CALCIUM 7.6*   < > 7.7*  --   --   --  7.2*  --   ?MG 1.9  --   --   --   --   --   --   --   ?PHOS 4.7*  --   --   --   --   --   --   --   ? < > = values in this interval not displayed.  ? ? ?Lipid Panel:  ?Recent Labs  ?Lab 01/16/22 ?0312  ?CHOL 66  ?TRIG 55  ?HDL 14*  ?CHOLHDL 4.7  ?VLDL 11  ?[place] 41  ? ? ?HgbA1c:  ?Recent Labs  ?Lab 01/14/22 ?0416  ?HGBA1C 5.8*  ? ? ?Urine Drug Screen:  ?Recent Labs  ?Lab 01/03/2022 ?2325  ?LABOPIA NONE DETECTED  ?COCAINSCRNUR NONE DETECTED  ?LABBENZ NONE DETECTED  ?AMPHETMU NONE DETECTED  ?THCU NONE DETECTED  ?LABBARB NONE DETECTED  ? ?   ?Alcohol Level No results for input(s): ETH in the last 168 hours. ? ?IMAGING past 24 hours ?DG CHEST PORT 1 VIEW ? ?Result Date: 01/19/2022 ?CLINICAL DATA:  Respiratory failure EXAM: PORTABLE CHEST 1 VIEW COMPARISON:  Previous studies including the examination of 01/17/2022 FINDINGS: Cardiac size is within normal limits. There is interval decrease in patchy infiltrates in the right  lung. There are no new focal pulmonary infiltrates. Lateral CP angles are clear. There is no pneumothorax. Tip of endotracheal tube is 3 cm above the carina. Tip of central venous catheter is seen in the right atrium. IMPRESSION: There is partial clearing of infiltrates in right lung suggesting resolving pneumonia. There are no new infiltrates or signs alveolar pulmonary edema. Electronically Signed   By: Elmer Picker M.D.   On: 01/19/2022 09:14   ? ?PHYSICAL EXAM ? ?Temp:  [98.4 ?F (36.9 ?C)-102.6 ?F (39.2 ?C)] 102.6 ?F (39.2 ?C) (04/18 1149) ?Pulse Rate:  [99-125] 125 (04/18 1128) ?Resp:  [15-36] 25 (04/18 1128) ?BP: (97-152)/(56-89) 102/58 (04/18 1128) ?SpO2:  [71 %-100 %] 96 % (04/18 1128) ?Arterial Line BP: (87-116)/(53-67) 108/60 (04/18 1100) ?FiO2 (%):  [30 %-50 %] 30 % (04/18 1128) ?Weight:  [81.2 kg] 81.2 kg (04/18 0500) ? ?General - Well nourished, well developed, intubated on precedex. ? ?Ophthalmologic - fundi not visualized due to noncooperation. ? ?Cardiovascular - Regular rhythm but tachycardia. ? ?Neuro - intubated on precedex, eyes closed, not following commands. With forced eye opening, eyes in mid position, not blinking to visual threat,  weak doll's eyes sluggish, not tracking, pupils equal 61m, sluggish to light. Corneal reflex present. No gag/cough.  ?Facial symmetry not able to test due to ET tube.  Tongue protrusion not cooperative.  ?Motor/sensory: no movement of all extremities. No grimace.  ?No babinski.  ? ? ? ?ASSESSMENT/PLAN ?Mr. Michael ALAis a 61y.o. male with history of pancreatic cancer with  metastases to the liver and brain and HTN presenting with acute onset vertigo, nausea and vomiting.  He presented to the ED and was found to have an acute large right cerebellar stroke.  He was also found to have acute bilateral PEs and DVTs along with sepsis from an unclear source.  He was started on IV heparin for the PEs and DVTs and was given 3% HTS to treat cerebellar edema caused by his stroke. Na is within therapeutic range, HTS stopped.  Heparin was changed to bivalrudin due to inability to obtain therapeutic level.  Family wishes to continue aggressive measures.  Intubated on minimal sedation requiring norepinephrine and vasopressin to maintain his MAP goal.  ? ?Stroke:  right large PICA infarct with cytotoxic edema and mass effect, likely secondary to hypercoagulability from metastatic pancreatic cancer  ?CT head evolving acute right PICA territory infarct in right cerebellar hemisphere with posterior fossa mass effect but no hydrocephalus ?CTA head & neck occlusion of right vertebral artery at C2 level, occluded right PICA, enlargement of right lobe of thyroid gland ?CT perfusion 536mregion of hypoperfused parenchyma in right cerebellar hemisphere ?MRI  acute to early subacute right PICA infarct with mild edema and partial effacement of fourth ventricle, no hydrocephalus, scattered additional infarcts in bilateral cerebral and cerebellar hemispheres ?CT repeat 4/15 stable right PICA infarct without hydrocephalus ?CT repeat 4/17 stable infarct with mass effect but no hydro ?2D Echo EF 60-65%moderate LVH, normal atrial septum ?LDL 41. ?HgbA1c 5.8 ?VTE prophylaxis - fully anticoagulated with bivalrudin ?No antithrombotic prior to admission, now on bivalrudin given difficulty with heparin level ?Therapy recommendations:  pending ?Disposition:  pending ? ?Respiratory Failure ?Patient failed swallow study and has likely aspirated ?CXR shows bilateral pneumonia, right pleural effusion ?Respirations labored and  tachypneic ?On NRB 4/15 ?intubated by CCM 4/16 ? ?History of Hypertension, now hypotensive ?Home meds:  none ?Unstable, requirement of levophed increased ?MAP goal >65 ?Long-term BP goal normotensive ? ?  Hyperlipidemia ?Home meds:  none ?LDL 41, goal < 70 ?Statin not indicated as LDL below goal ? ?Sepsis with uncertain source ?Follow cultures - so far neg ?Antibiotics per primary team ?MAP goal >65 ? ?Acute bilateral PE with bilateral lower extremity DVT ?Bilateral DVTs seen on lower extremity doppler ?Acute bilateral PE with evidence of right heart strain seen on CT chest ?anticoagulated with IV heparin -> bivalrudin now ?Management per primary team ? ?Pancreatic adenocarcinoma with mets to the liver and brain with possible leptomeningeal disease ?Receiving palliative chemotherapy at [facility] ?Consider oncology consult ? ?Other Stroke Risk Factors ?Full code per daughter ? ?Other Active Problems ?Enlargement of right lobe of thyroid gland ?Nonurgent thyroid ultrasound  ? ?Hospital day # 6 ? ? ?This patient is critically ill due to large cerebellar infarct, posterior fossa mass effect, bilateral extensive PE and DVT, respiratory failure with intubation, metastasis from pancreatic cancer and at significant risk of neurological worsening, death form brain herniation, hydrocephalus, heart failure, respiratory failure. This patient's care requires constant monitoring of vital signs, hemodynamics, respiratory and cardiac monitoring, review of multiple databases, neurological assessment, discussion with family, other specialists and medical decision making of high complexity. I spent 35 minutes of neurocritical care time in the care of this patient.  I discussed with Dr. Lamonte Sakai. Updated family member in the room, she will consider withdrawing care in light of poor prognosis but will discuss with entire family. Offered to come back and discuss with family later today if needed.  ? ? ? ? ?To contact Stroke Continuity provider,  please refer to [email or web address] ?After hours, contact General Neurology  ?

## 2022-01-19 NOTE — Progress Notes (Signed)
ANTICOAGULATION CONSULT NOTE- follow-up ? ?Pharmacy Consult for bivalirudin ?Indication: pulmonary embolus, DVT, and stroke ? ?Allergies  ?Allergen Reactions  ? Lisinopril Anaphylaxis  ? Amoxicillin Nausea And Vomiting  ? Aripiprazole Other (See Comments)  ?  Made patient feel "really low" per daughter at bedside  ? Olmesartan Other (See Comments)  ?  "bothered stomach"  ? Shellfish Allergy Nausea Only  ?  "Bad headache"  ? ? ?Patient Measurements: ?Height: '5\' 9"'$  (175.3 cm) ?Weight: 81.2 kg (179 lb 0.2 oz) ?IBW/kg (Calculated) : 70.7 ? ?Vital Signs: ?Temp: 102.5 ?F (39.2 ?C) (04/18 2000) ?Temp Source: Axillary (04/18 2000) ?BP: 106/60 (04/18 1520) ?Pulse Rate: 79 (04/18 2000) ? ?Labs: ?Recent Labs  ?  01/17/22 ?0454 01/17/22 ?0953 01/18/22 ?9326 01/18/22 ?7124 01/19/22 ?0025 01/19/22 ?5809 01/19/22 ?9833 01/19/22 ?1333 01/19/22 ?1451 01/19/22 ?2022  ?HGB  --    < > 8.2*   < >  --  7.3*  --  7.0*  --  6.1*  ?HCT  --    < > 25.5*   < >  --  22.9*  --  21.9*  --  20.1*  ?PLT  --   --  100*  --   --  PLATELET CLUMPS NOTED ON SMEAR, UNABLE TO ESTIMATE  --  33* 33* 34*  ?APTT  --    < >  --    < >  --   --  96* 98* 106*  --   ?LABPROT  --   --   --   --  55.4*  --   --   --  54.2*  --   ?INR  --   --   --   --  6.3*  --   --   --  6.2*  --   ?CREATININE 1.64*  --  1.85*  --   --  2.17*  --   --   --   --   ? < > = values in this interval not displayed.  ? ? ?Estimated Creatinine Clearance: 36.2 mL/min (A) (by C-G formula based on SCr of 2.17 mg/dL (H)). ? ?Assessment: ?61 yo M with metastatic pancreatic cancer admitted with sepsis and acute metabolic encephalopathy. Pt started on heparin for bilateral PE with right heart strain and acute bilateral DVTs. Pt also with large right cerebellar stroke. Neurology and primary team in agreement with anticoagulation due to large clot burden even with new large stroke. Request no bolus and low goal. ? ?DIC panel: INR 6.2, aPTT 106, fibrinogen 114, D-dimer >20.00, no  SCHISTOCYTES ?CBC: hemoglobin 6.1, plt 34 ? ?aPTT 138 (on bivalirudin 0.024 mg/kg/hr) ? ?Goal of Therapy:  ?aPTT goal 50-85 sec (target low end of goal) ?Monitor platelets by anticoagulation protocol: Yes ?  ?Plan:   ?Spoke with CCM Provider, will hold bivalirudin for now ?Plan to reassess aPTT '@0200'$  for further adjustments ?Daily aPTT level and CBC ordered ?Monitor for signs/symptoms of bleed ?Planning for comfort measures in the am. ? ?Thank you for allowing pharmacy to be a part of this patient?s care. ? ?Donnald Garre, PharmD ?Clinical Pharmacist ? ?Please check AMION for all [place] numbers ?After 10:00 PM, call [place] [phone] ? ? ? ? ? ? ? ? ? ? ?

## 2022-01-19 NOTE — Progress Notes (Addendum)
ANTICOAGULATION CONSULT NOTE- follow-up ? ?Pharmacy Consult for bivalirudin ?Indication: pulmonary embolus, DVT, and stroke ? ?Allergies  ?Allergen Reactions  ? Lisinopril Anaphylaxis  ? Amoxicillin Nausea And Vomiting  ? Aripiprazole Other (See Comments)  ?  Made patient feel "really low" per daughter at bedside  ? Olmesartan Other (See Comments)  ?  "bothered stomach"  ? Shellfish Allergy Nausea Only  ?  "Bad headache"  ? ? ?Patient Measurements: ?Height: '5\' 9"'$  (175.3 cm) ?Weight: 81.2 kg (179 lb 0.2 oz) ?IBW/kg (Calculated) : 70.7 ? ?Vital Signs: ?Temp: 102.6 ?F (39.2 ?C) (04/18 1149) ?Temp Source: Axillary (04/18 1149) ?BP: 102/58 (04/18 1128) ?Pulse Rate: 125 (04/18 1128) ? ?Labs: ?Recent Labs  ?  01/17/22 ?0454 01/17/22 ?0953 01/18/22 ?8676 01/18/22 ?7209 01/18/22 ?1315 01/18/22 ?1730 01/18/22 ?2245 01/19/22 ?0025 01/19/22 ?4709 01/19/22 ?6283 01/19/22 ?1333  ?HGB  --    < > 8.2*  --  8.8*  --   --   --  7.3*  --  7.0*  ?HCT  --    < > 25.5*  --  26.0*  --   --   --  22.9*  --  21.9*  ?PLT  --   --  100*  --   --   --   --   --  PLATELET CLUMPS NOTED ON SMEAR, UNABLE TO ESTIMATE  --  PENDING  ?APTT  --    < >  --    < >  --    < > 52*  --   --  96* 98*  ?LABPROT  --   --   --   --   --   --   --  55.4*  --   --   --   ?INR  --   --   --   --   --   --   --  6.3*  --   --   --   ?CREATININE 1.64*  --  1.85*  --   --   --   --   --  2.17*  --   --   ? < > = values in this interval not displayed.  ? ? ?Estimated Creatinine Clearance: 36.2 mL/min (A) (by C-G formula based on SCr of 2.17 mg/dL (H)). ? ?Assessment: ?61 yo M with metastatic pancreatic cancer admitted with sepsis and acute metabolic encephalopathy. Pt started on heparin for bilateral PE with right heart strain and acute bilateral DVTs. Pt also with large right cerebellar stroke. Neurology and primary team in agreement with anticoagulation due to large clot burden even with new large stroke. Request no bolus and low goal. ? ?Bivalirudin initiated after  difficulty obtaining therapeutic heparin levels. CBC remains low but stable. No s/sx of bleeding reported. ? ?aPTT 98 seconds after decrease to 0.027 mg/kg/hr, also declining renal function as a factor.   ? ?Goal of Therapy:  ?aPTT goal 50-85 sec (target low end of goal) ?Monitor platelets by anticoagulation protocol: Yes ?  ?Plan:   ?Decrease bivalirudin to 0.024 mg/kg/hr.  ?F/u 4h aPTT ?F/u repeat CBC for thrombocytopenia ?S/s bleeding ? ?Bertis Ruddy, PharmD ?Clinical Pharmacist ?ED Pharmacist Phone # [phone] ?01/19/2022 2:11 PM ? ? ? ? ? ? ? ? ? ? ?

## 2022-01-19 NOTE — Progress Notes (Signed)
SLP Cancellation Note ? ?Patient Details ?Name: Michael Kemp ?MRN: [id number] ?DOB: 09/16/1961 ? ? ?Cancelled treatment:       Reason Eval/Treat Not Completed: Medical issues which prohibited therapy. Will sign off at this time ? ? ?Stokely Jeancharles, Katherene Ponto ?01/19/2022, 8:02 AM ?

## 2022-01-19 NOTE — Progress Notes (Signed)
eLink Physician-Brief Progress Note ?Patient Name: Michael Kemp ?DOB: 1961/09/03 ?MRN: [id number] ? ? ?Date of Service ? 01/19/2022  ?HPI/Events of Note ? Nursing reports increased bruising and swelling of R arm and lower extremities. Patient is on Angiomax for bilateral PE with R heart strain, acute bilateral DVT's and large R cerebellar stroke. Extremity swelling likely d/t large clot burden, high R heart and venous pressures and Albumin < 1.5. Last PTT = 52 which is the desired low end of therapeutic range.   ?eICU Interventions ? Plan: ?PT/INR STAT. ?Further Angiomax management per Pharmacy.  ? ? ? ?  ? ?Dandrea Widdowson [name] ?01/19/2022, 12:21 AM ?

## 2022-01-19 NOTE — Progress Notes (Addendum)
ANTICOAGULATION CONSULT NOTE- follow-up ? ?Pharmacy Consult for bivalirudin ?Indication: pulmonary embolus, DVT, and stroke ? ?Allergies  ?Allergen Reactions  ? Lisinopril Anaphylaxis  ? Amoxicillin Nausea And Vomiting  ? Aripiprazole Other (See Comments)  ?  Made patient feel "really low" per daughter at bedside  ? Olmesartan Other (See Comments)  ?  "bothered stomach"  ? Shellfish Allergy Nausea Only  ?  "Bad headache"  ? ? ?Patient Measurements: ?Height: '5\' 9"'$  (175.3 cm) ?Weight: 81.2 kg (179 lb 0.2 oz) ?IBW/kg (Calculated) : 70.7 ? ?Vital Signs: ?Temp: 98.4 ?F (36.9 ?C) (04/18 0400) ?Temp Source: Axillary (04/18 0400) ?BP: 111/77 (04/18 0500) ?Pulse Rate: 102 (04/18 0500) ? ?Labs: ?Recent Labs  ?  01/16/22 ?1018 01/16/22 ?1611 01/17/22 ?0301 01/17/22 ?0454 01/17/22 ?8242 01/18/22 ?3536 01/18/22 ?1443 01/18/22 ?1315 01/18/22 ?1730 01/18/22 ?2245 01/19/22 ?0025 01/19/22 ?1540 01/19/22 ?0867  ?HGB  --    < > 8.3*  --    < > 8.2*  --  8.8*  --   --   --  7.3*  --   ?HCT  --    < > 25.4*  --    < > 25.5*  --  26.0*  --   --   --  22.9*  --   ?PLT  --   --  135*  --   --  100*  --   --   --   --   --  PLATELET CLUMPS NOTED ON SMEAR, UNABLE TO ESTIMATE  --   ?APTT  --    < > 89*  --    < >  --    < >  --  39* 52*  --   --  96*  ?LABPROT  --   --   --   --   --   --   --   --   --   --  55.4*  --   --   ?INR  --   --   --   --   --   --   --   --   --   --  6.3*  --   --   ?HEPARINUNFRC 0.20*  --   --   --   --   --   --   --   --   --   --   --   --   ?CREATININE  --   --   --  1.64*  --  1.85*  --   --   --   --   --   --   --   ? < > = values in this interval not displayed.  ? ? ?Estimated Creatinine Clearance: 42.5 mL/min (A) (by C-G formula based on SCr of 1.85 mg/dL (H)). ? ?Assessment: ?61 yo M with metastatic pancreatic cancer admitted with sepsis and acute metabolic encephalopathy. Pt started on heparin for bilateral PE with right heart strain and acute bilateral DVTs. Pt also with large right cerebellar  stroke. Neurology and primary team in agreement with anticoagulation due to large clot burden even with new large stroke. Request no bolus and low goal. ? ?Bivalirudin initiated after difficulty obtaining therapeutic heparin levels. CBC remains low but stable. No s/sx of bleeding reported. ? ?aPTT 96 - RN reports bruising on arm and inner thighs. INR 6.3 overnight. HgB down slightly. CCM provider aware and wishes to continue infusion. Will order repeat aPTT in 4-6 hours and repeat CBC ? ?Goal  of Therapy:  ?aPTT goal 50-85 sec (target low end of goal) ?Monitor platelets by anticoagulation protocol: Yes ?  ?Plan:   ?Decrease bivalirudin to 0.027 mg/kg/hr.  ?Daily CBC, aPTT q12h ?Monitor for s/sx of bleeding ? ?Thank you for allowing pharmacy to be a part of this patient?s care. ? ?Georga Bora, PharmD ?Clinical Pharmacist ?01/19/2022 6:06 AM ?Please check AMION for all [place] numbers ? ? ? ? ? ? ? ?

## 2022-01-19 NOTE — Progress Notes (Signed)
eLink Physician-Brief Progress Note ?Patient Name: Michael Kemp ?DOB: [birth year] ?MRN: [id number] ? ? ?Date of Service ? 01/19/2022  ?HPI/Events of Note ? MAP 59 on 40 mcg of Levophed gtt, per bedside RN plan is for probable transition to comfort measures in the AM.  ?eICU Interventions ? Levophed gtt ceiling increased to 50 mcg, and MAP goal reduced to > 60 mmHg.  ? ? ? ?  ? ?Frederik Pear ?01/19/2022, 8:57 PM ?

## 2022-01-20 MED ORDER — ONDANSETRON HCL 4 MG/2ML IJ SOLN
4.0000 mg | Freq: Four times a day (QID) | INTRAMUSCULAR | Status: DC | PRN
Start: 2022-01-20 — End: 2022-01-20

## 2022-01-20 MED ORDER — GLYCOPYRROLATE 1 MG PO TABS: 1.0000 mg | ORAL_TABLET | ORAL | Status: DC | PRN

## 2022-01-20 MED ORDER — ACETAMINOPHEN 650 MG RE SUPP: 650.0000 mg | Freq: Four times a day (QID) | RECTAL | Status: DC | PRN

## 2022-01-20 MED ORDER — LORAZEPAM 1 MG PO TABS: 1.0000 mg | ORAL_TABLET | ORAL | Status: DC | PRN

## 2022-01-20 MED ORDER — BACLOFEN 5 MG HALF TABLET
5.0000 mg | ORAL_TABLET | Freq: Two times a day (BID) | ORAL | Status: DC | PRN
  Filled 2022-01-20: qty 1

## 2022-01-20 MED ORDER — HALOPERIDOL LACTATE 2 MG/ML PO CONC
0.5000 mg | ORAL | Status: DC | PRN
  Filled 2022-01-20: qty 0.3

## 2022-01-20 MED ORDER — LORAZEPAM 2 MG/ML IJ SOLN: 1.0000 mg | INTRAMUSCULAR | Status: DC | PRN

## 2022-01-20 MED ORDER — GLYCOPYRROLATE 0.2 MG/ML IJ SOLN: 0.2000 mg | INTRAMUSCULAR | Status: DC | PRN

## 2022-01-20 MED ORDER — DIPHENHYDRAMINE HCL 50 MG/ML IJ SOLN: 12.5000 mg | INTRAMUSCULAR | Status: DC | PRN

## 2022-01-20 MED ORDER — SODIUM CHLORIDE 0.9 % IV SOLN
250.0000 mL | INTRAVENOUS | Status: DC | PRN
Start: 2022-01-20 — End: 2022-01-20

## 2022-01-20 MED ORDER — BIOTENE DRY MOUTH MT LIQD: 15.0000 mL | OROMUCOSAL | Status: DC | PRN

## 2022-01-20 MED ORDER — HALOPERIDOL 0.5 MG PO TABS
0.5000 mg | ORAL_TABLET | ORAL | Status: DC | PRN
  Filled 2022-01-20: qty 1

## 2022-01-20 MED ORDER — HALOPERIDOL LACTATE 5 MG/ML IJ SOLN: 0.5000 mg | INTRAMUSCULAR | Status: DC | PRN

## 2022-01-20 MED ORDER — ALBUMIN HUMAN 5 % IV SOLN
25.0000 g | Freq: Once | INTRAVENOUS | Status: AC
Start: 2022-01-20 — End: 2022-01-20
  Administered 2022-01-20: 25 g via INTRAVENOUS
  Filled 2022-01-20: qty 500

## 2022-01-20 MED ORDER — SODIUM CHLORIDE 0.9% FLUSH: 3.0000 mL | INTRAVENOUS | Status: DC | PRN

## 2022-01-20 MED ORDER — ONDANSETRON 4 MG PO TBDP: 4.0000 mg | ORAL_TABLET | Freq: Four times a day (QID) | ORAL | Status: DC | PRN

## 2022-01-20 MED ORDER — POLYVINYL ALCOHOL 1.4 % OP SOLN
1.0000 [drp] | Freq: Four times a day (QID) | OPHTHALMIC | Status: DC | PRN
  Filled 2022-01-20: qty 15

## 2022-01-20 MED ORDER — MORPHINE SULFATE (PF) 2 MG/ML IV SOLN
1.0000 mg | INTRAVENOUS | Status: DC | PRN
  Filled 2022-01-20: qty 1

## 2022-01-20 MED ORDER — ACETAMINOPHEN 325 MG PO TABS: 650.0000 mg | ORAL_TABLET | Freq: Four times a day (QID) | ORAL | Status: DC | PRN

## 2022-01-20 MED ORDER — SODIUM CHLORIDE 0.9% FLUSH: 3.0000 mL | Freq: Two times a day (BID) | INTRAVENOUS | Status: DC

## 2022-01-20 MED ORDER — LORAZEPAM 2 MG/ML PO CONC: 1.0000 mg | ORAL | Status: DC | PRN

## 2022-02-01 NOTE — Progress Notes (Signed)
Notified family, Linford Arnold Mapp, of patients increasing pressor requirements and steadily declining status. Advised her to notify family and come to hospital as patient continues to decline despite increased efforts. Evelena Peat stated she would notify [facility], patients daughter.  ?

## 2022-02-01 NOTE — Progress Notes (Signed)
eLink Physician-Brief Progress Note ?Patient Name: Michael Kemp ?DOB: 11-06-1960 ?MRN: [id number] ? ? ?Date of Service ? 02/10/22  ?HPI/Events of Note ? Hypotension despite maximum gtt rates of Norepinephrine and Vasopressin.  ?eICU Interventions ? Albumin 5 % 500 ml iv bolus x 1 ordered, family has been notified by the bedside RN that the patient is declining rapidly.  ? ? ? ?  ? ?Frederik Pear ?02/10/22, 2:58 AM ?

## 2022-02-01 NOTE — Progress Notes (Signed)
At 0527 patients monitors showed asystole, This RN and Terence Lux RN auscultated patients heart for 1 minute each and time of death was announced at [phone]. CCM notified via Elink at [facility]. This RN has offered support to family and provided them with space and time to process patients passing. Representative from [facility] [facility] at bedside, requesting to transfer patient from room to their custody.  ?0630 Contacted security who state they will walk funeral home representative to room, contacted patient placement who stated patient must be checked into morgue and then checked out of morgue prior to the body departing the facility.  ?Family still at bedside processing loss. Will continue to assist family as needed.  ?

## 2022-02-01 NOTE — Progress Notes (Signed)
eLink Physician-Brief Progress Note ?Patient Name: Michael Kemp ?DOB: 07-Nov-1960 ?MRN: [id number] ? ? ?Date of Service ? 2022/02/14  ?HPI/Events of Note ? FAMILY ARRIVED AND REQUESTED COMPASSIONATE EXTUBATION AND TRANSITION TO COMFORT MEASURES, I EXPLAINED THE PROCESS TO THEM IN DETAIL AND ANSWERED ALL QUESTIONS.  ?eICU Interventions ? ORDERS ENTERED.  ? ? ? ?  ? ?Frederik Pear ?02/14/22, 4:50 AM ?

## 2022-02-01 NOTE — Progress Notes (Signed)
eLink Physician-Brief Progress Note ?Patient Name: Michael Kemp ?DOB: 1961/06/07 ?MRN: [id number] ? ? ?Date of Service ? 02/04/2022  ?HPI/Events of Note ? BP 78/50.  ?eICU Interventions ? Levo gtt ceiling increased to 60 mcg, bedside RN instructed to wean Precedex gtt as tolerated targeting  RAS of -1 to -3.  ? ? ? ?  ? ?Frederik Pear ?2022-02-04, 2:22 AM ?

## 2022-02-01 NOTE — Procedures (Signed)
Extubation Procedure Note ? ?Patient Details:   ?Name: MUHAMMED TEUTSCH ?DOB: 1961-04-25 ?MRN: [id number] ?  ?Airway Documentation:  ?Airway 8 mm (Active)  ?Secured at (cm) 24 cm 02/03/2022 0316  ?Measured From Lips 03-Feb-2022 0316  ?Secured Location Left 2022-02-03 0316  ?Secured By [employer] 2022/02/03 0316  ?Tube Holder Repositioned Yes Feb 03, 2022 0316  ?Prone position No Feb 03, 2022 0316  ?Cuff Pressure (cm H2O) Clear OR 27-39 CmH2O 02-03-2022 0316  ?Site Condition Dry 02-03-22 0316  ? ?Vent end date: 02-03-22 Vent end time: 2951  ? ?Evaluation ? O2 sats: currently acceptable ?Complications: No apparent complications ?Patient did tolerate procedure well. ?Bilateral Breath Sounds: Rhonchi ?  ?Terminal Extubation ? ?Catha Brow ?2022-02-03, 4:57 AM ? ?

## 2022-02-01 NOTE — Death Summary Note (Addendum)
? ?  Death Summary  ? ?Michael Kemp:[id number] DOB: Sep 04, 1961 DOA: 01-19-2022 ? ?PCP: Lorelee Market, MD ? ?Admit date: Jan 19, 2022 ?Date of Death: 01/26/2022 ? ?Final Diagnoses:  ?Principal Problem: ?  Sepsis ([place]) ?Active Problems: ?  Pressure injury of skin ?  Malnutrition of moderate degree ?  Bilateral pulmonary embolism (HCC) ?  Acute bilateral deep vein thrombosis (DVT) of femoral veins (HCC) ?  Cerebellar stroke ([place]) ?  Cerebral edema (HCC) ?  Pancreatic cancer metastasized to liver [facility]) ?  Acute respiratory failure with hypoxia ([place]) ?AKI with ATN ? ?History of present illness:  ?61 y/o M with a PMH significant for pancreatic adenocarcinoma with mets to the liver and recent leptomeningeal spead on MRI brain/spine at [facility], diabetes, HTN, GERD, and failure to thrive who presented to the [facility] ED for sudden onset confusion. Prior to this, the patient was interactive with family; he was showing them how to fold sheets in the beds in their basement as he lives at home with his daughter and son-in-law.  He suddenly became confused.  He complained of abdominal pain at that time per his daughter.  EMS was activated and he was brought into the ED for further evaluation. ? ?Hospital Course:  ? ?On ED arrival, patient was seen with stable vital signs but new underlying confusion persisted. Labwork significant for leukocytosis, anemia, AKI, and elevated liver enzymes. CT angio chest/abdomen/pelvis obtained on arrival and revealed bilateral pulmonary emboli with CT evidence of right heart strain. He was started on IV Heparin infusion. Patient also noted to have severe sepsis with suspected pulmonary versus abdominal source. He was started on Vancomycin, Cefepime and Metronidazole.  ? ?On 01/14/2022, MRI brain was completed and revealed acute to early subacute right PICA infarct with mild edema and partial effacement of fourth ventricle, scattered additional smaller infarcts, potentially occluded proximal right V4  segment flow, and known leptomeningeal disease. Patient started on hypertonic saline due to significant cytotoxic edema noted on imaging. Patient transferred from [place] to [facility] for further neurologic care.  ? ?Unfortunately on 01/17/2022, patient was noted to be increasing lethargic with weak cough and gag concerning for inability to protect airway and impending respiratory failure. Due to this, decision was made to intubate. Chest x-ray demonstrated worsening right-sided airway opacities concerning for ongoing aspiration pneumonia. Antibiotics were narrowed to Unasyn and Vancomycin. MRSA PCR negative, Vancomycin discontinued.  ? ?Subsequently, septic shock continued to progress requiring vasopressors and stress dose steroids. Worsening thrombocytopenia and anemia with skin changes concerning for hemolysis although DIC panel did not show schistocytes. In the early hours of 2022-01-26, vasopressor requirements were rapidly increasing with lack of response. Family was called to bedside and decision was made to transition to comfort care only with terminal extubation.  ? ?Signed: ?Dr. Jose Persia ?Internal Medicine PGY-3  ?01/26/22, 7:44 AM ? ?Addended: ?Baltazar Apo, MD, PhD ?02/05/2022, 9:37 AM ?[facility] ?[phone] or if no answer before 7:00PM call [phone] ?For any issues after 7:00PM please call eLink [phone] ? ?

## 2022-02-01 DEATH — deceased

## 2022-09-07 IMAGING — CT CT HEAD W/O CM
4 series · 15 of 47 positions shown, 17 images · non-contrast
Comparison: Head CT 01/16/2022 and earlier.

CLINICAL DATA: [DATE]-year-old [place] with right PICA infarct with
posterior fossa mass effect. Right vertebral artery occlusion.
Metastatic pancreatic cancer.



[Series 3: head without · axial · non-contrast · 0.46mm/px · z∈[-139,-4]mm · 7 of 37 slices shown, 9 images]
[im 5/37  brain]
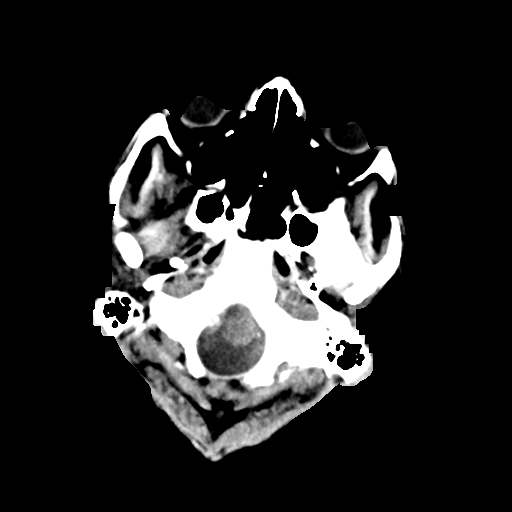
[im 5/37  bone]
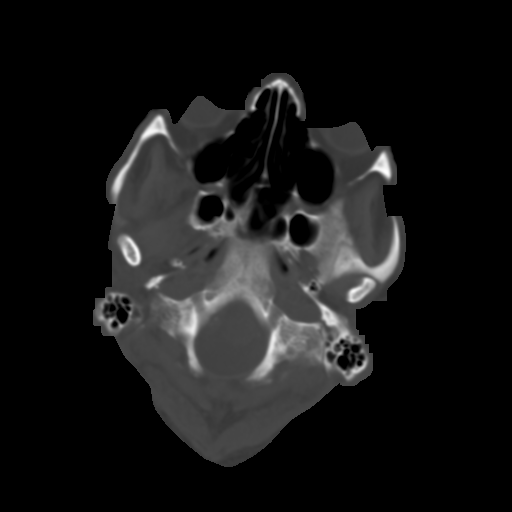
[im 10/37  brain]
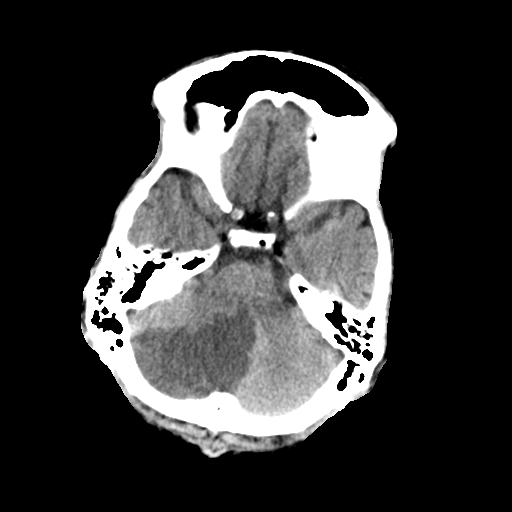
[im 14/37  brain]
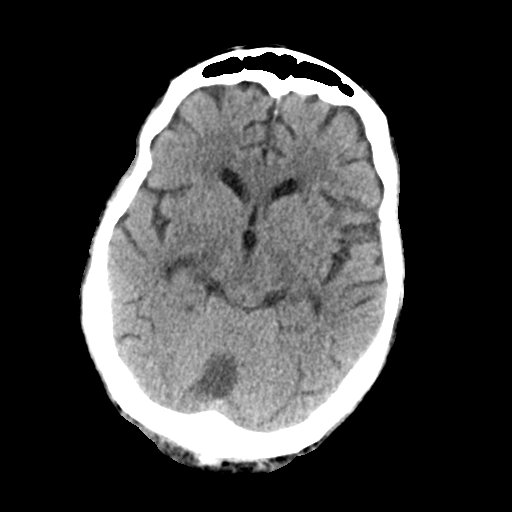
[im 19/37  brain]
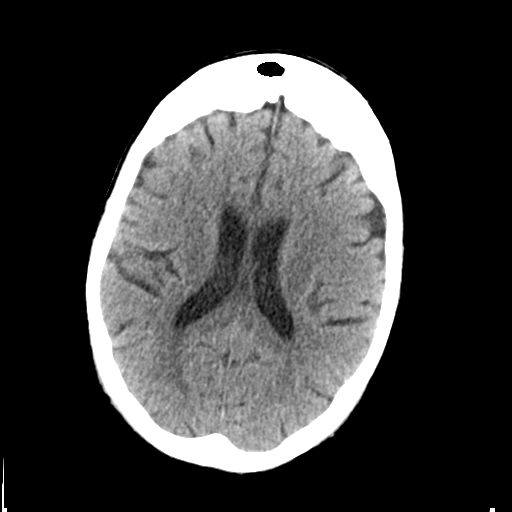
[im 23/37  brain]
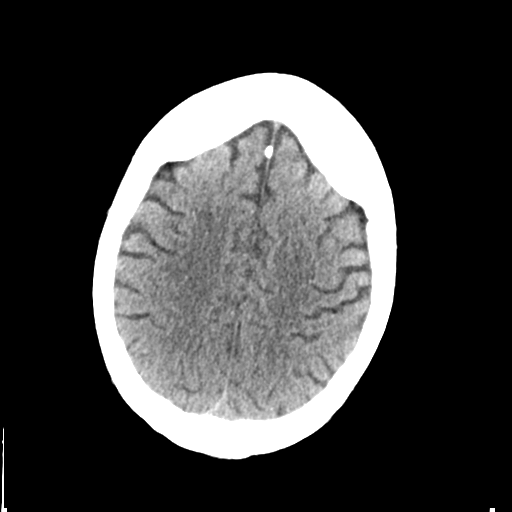
[im 23/37  bone]
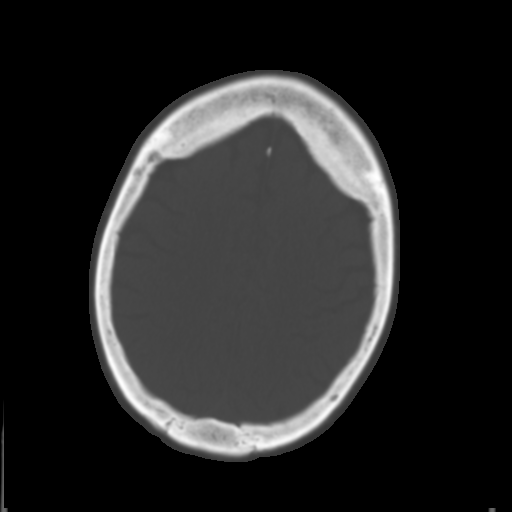
[im 28/37  brain]
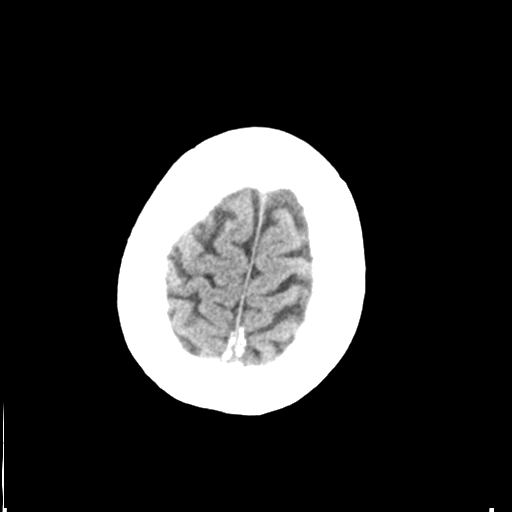
[im 32/37  brain]
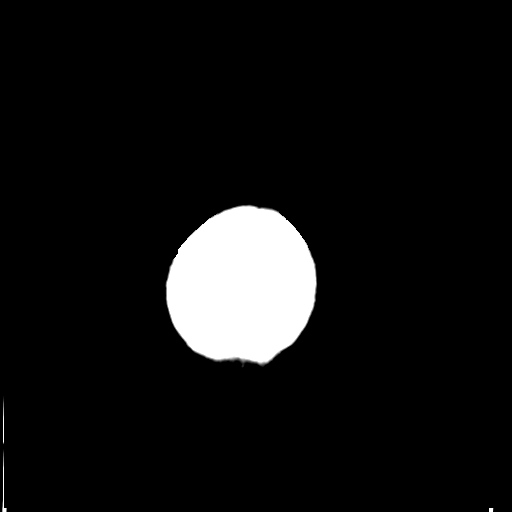

[Series 4: head bone · axial · 0.46mm/px · z∈[-141,-123]mm · 2 of 92 slices shown]
[im 10/92  bone]
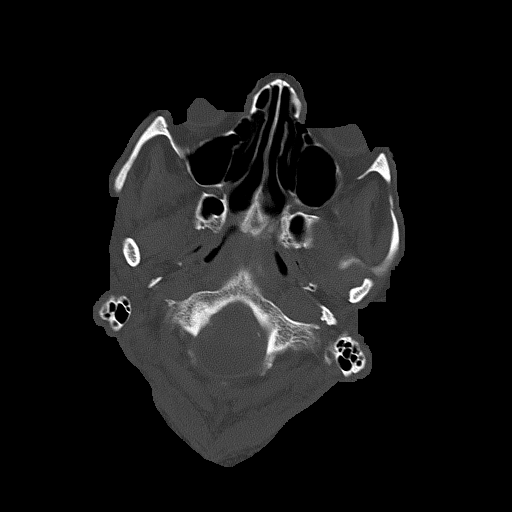
[im 19/92  bone]
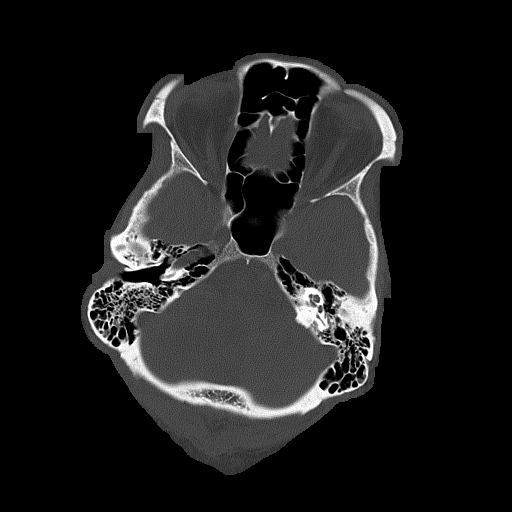

[Series 5: head without cor · coronal · non-contrast · 0.36mm/px · 3 of 77 slices shown]
[im 26/77  brain]
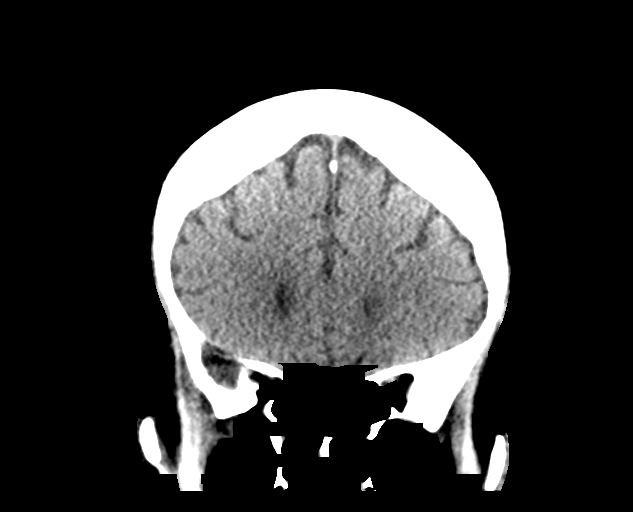
[im 34/77  brain]
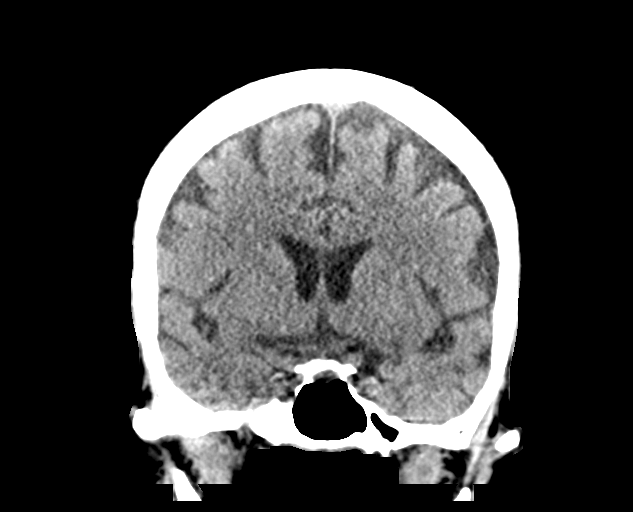
[im 43/77  brain]
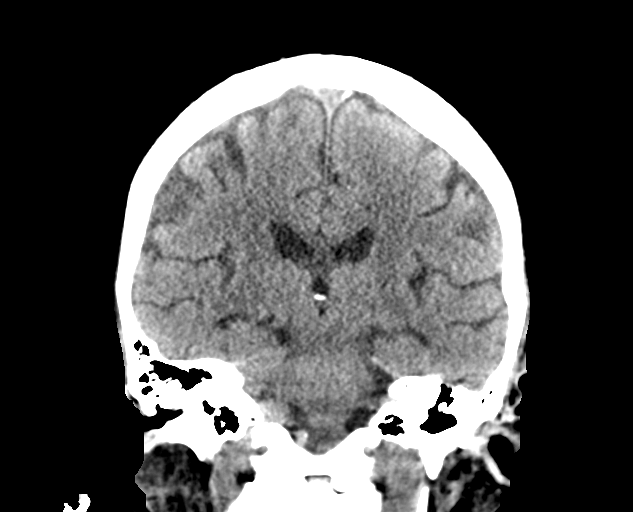

[Series 6: head without sag · sagittal · non-contrast · 0.36mm/px · 3 of 76 slices shown]
[im 26/76  brain]
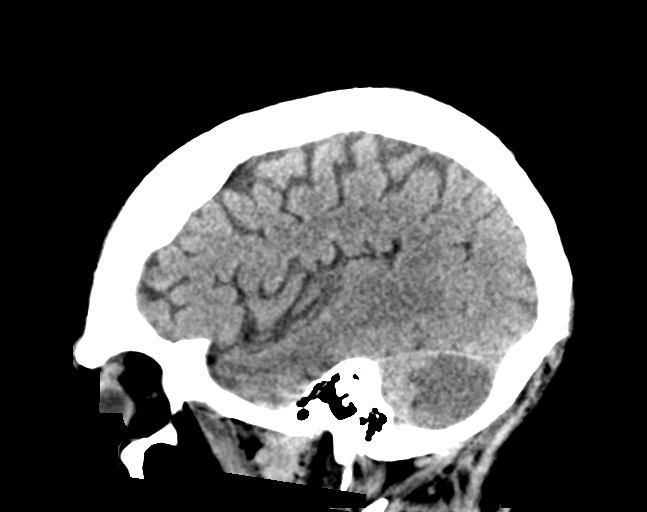
[im 38/76  brain]
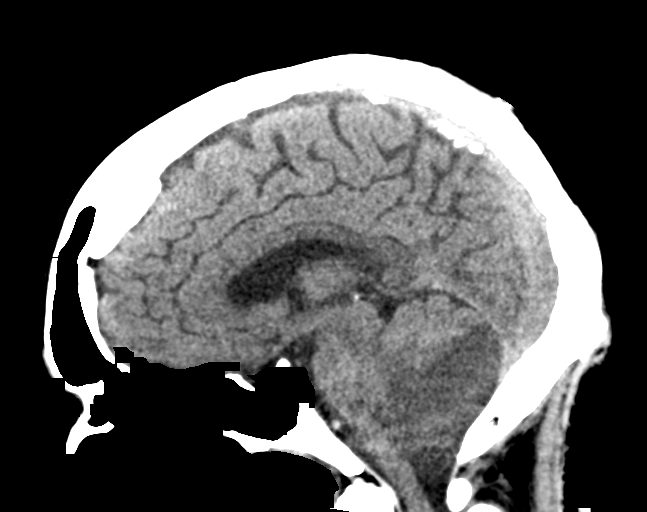
[im 51/76  brain]
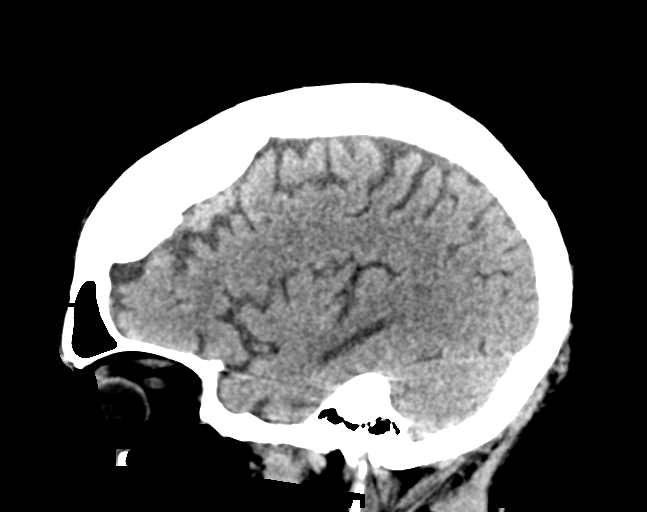

[15 of 47 positions shown; findings below may reference images not displayed]

FINDINGS: Brain: Continued confluent cytotoxic edema of relatively large right
cerebellar PICA territory infarct. No hemorrhagic transformation.

Mildly increased effacement of the 4th ventricle (series 6, image
40) however, no interval loss of basilar cisterns and no interval
enlargement of the lateral or 3rd ventricles.

Elsewhere gray-white matter differentiation is stable. Scattered
small supratentorial infarcts on MRI 01/14/2022 remain occult by CT.
No acute intracranial hemorrhage or new ischemia identified.

Vascular: Calcified atherosclerosis at the skull base.

Skull: No acute osseous abnormality identified.

Sinuses/Orbits: Visualized paranasal sinuses and mastoids are stable
and well aerated.

Other: Visualized orbits and scalp soft tissues are within normal
limits.
IMPRESSION: 1. Right PICA territory infarct with unrelenting cytotoxic edema,
but no hemorrhagic transformation. Mass effect on the 4th ventricle
persists, but there is no ventriculomegaly, and basilar cisterns
remain patent.
2. No new intracranial abnormality.

## 2022-09-08 IMAGING — DX DG CHEST 1V PORT
1 series · 1 of 1 positions shown · non-contrast
Comparison: Previous studies including the examination of
01/17/2022

CLINICAL DATA: Respiratory failure

EXAM:
PORTABLE CHEST 1 VIEW

[chest]
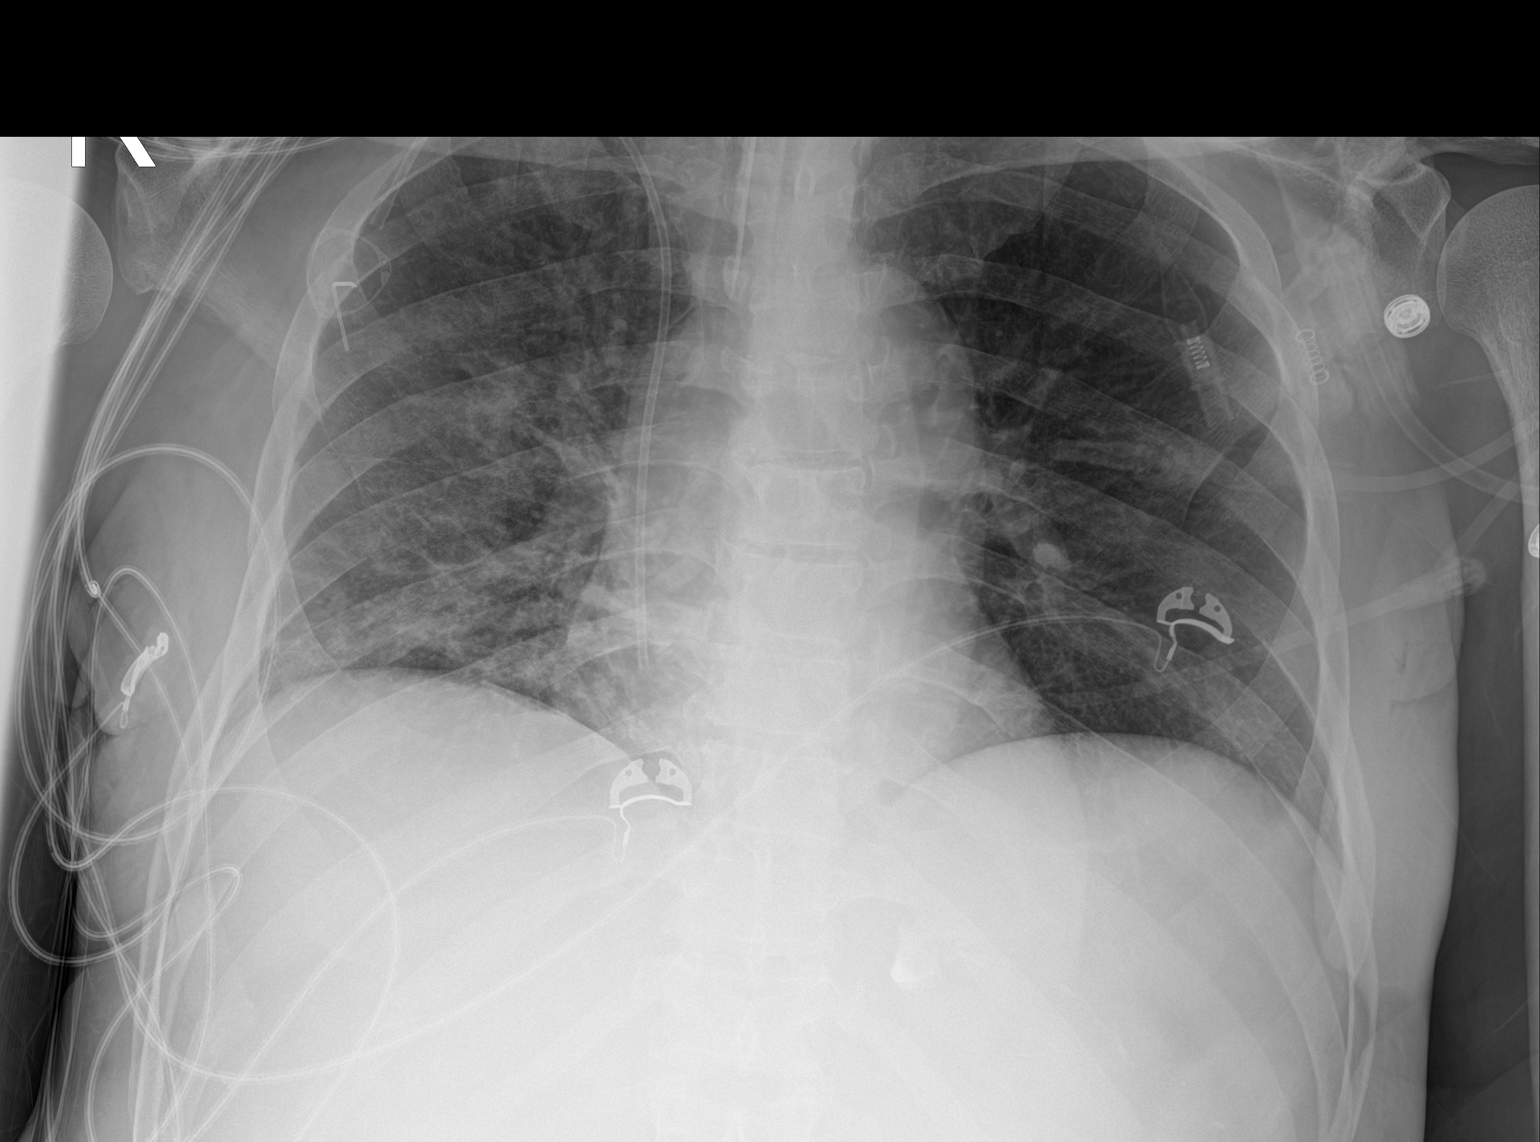

[1 of 1 positions shown; findings below may reference images not displayed]

FINDINGS: Cardiac size is within normal limits. There is interval decrease in
patchy infiltrates in the right lung. There are no new focal
pulmonary infiltrates. Lateral CP angles are clear. There is no
pneumothorax. Tip of endotracheal tube is 3 cm above the carina. Tip
of central venous catheter is seen in the right atrium.
IMPRESSION: There is partial clearing of infiltrates in right lung suggesting
resolving pneumonia. There are no new infiltrates or signs alveolar
pulmonary edema.
